# Patient Record
Sex: Female | Born: 1965 | Hispanic: No | Marital: Married | Smoking: Current every day smoker
Health system: Southern US, Community
[De-identification: ages and names within clinical notes are randomized; demographics above are authoritative.]

## PROBLEM LIST (undated history)

## (undated) DIAGNOSIS — I499 Cardiac arrhythmia, unspecified: Secondary | ICD-10-CM

## (undated) DIAGNOSIS — E7800 Pure hypercholesterolemia, unspecified: Secondary | ICD-10-CM

## (undated) DIAGNOSIS — I10 Essential (primary) hypertension: Secondary | ICD-10-CM

## (undated) DIAGNOSIS — R51 Headache: Secondary | ICD-10-CM

## (undated) DIAGNOSIS — K589 Irritable bowel syndrome without diarrhea: Secondary | ICD-10-CM

## (undated) DIAGNOSIS — F32A Depression, unspecified: Secondary | ICD-10-CM

## (undated) DIAGNOSIS — K219 Gastro-esophageal reflux disease without esophagitis: Secondary | ICD-10-CM

## (undated) DIAGNOSIS — J189 Pneumonia, unspecified organism: Secondary | ICD-10-CM

## (undated) DIAGNOSIS — IMO0001 Reserved for inherently not codable concepts without codable children: Secondary | ICD-10-CM

## (undated) DIAGNOSIS — I639 Cerebral infarction, unspecified: Secondary | ICD-10-CM

## (undated) DIAGNOSIS — E119 Type 2 diabetes mellitus without complications: Secondary | ICD-10-CM

## (undated) DIAGNOSIS — G629 Polyneuropathy, unspecified: Secondary | ICD-10-CM

## (undated) DIAGNOSIS — F419 Anxiety disorder, unspecified: Secondary | ICD-10-CM

## (undated) DIAGNOSIS — M797 Fibromyalgia: Secondary | ICD-10-CM

## (undated) DIAGNOSIS — F329 Major depressive disorder, single episode, unspecified: Secondary | ICD-10-CM

## (undated) DIAGNOSIS — N390 Urinary tract infection, site not specified: Secondary | ICD-10-CM

## (undated) DIAGNOSIS — F4310 Post-traumatic stress disorder, unspecified: Secondary | ICD-10-CM

## (undated) DIAGNOSIS — I671 Cerebral aneurysm, nonruptured: Secondary | ICD-10-CM

## (undated) DIAGNOSIS — J449 Chronic obstructive pulmonary disease, unspecified: Secondary | ICD-10-CM

## (undated) DIAGNOSIS — F319 Bipolar disorder, unspecified: Secondary | ICD-10-CM

## (undated) DIAGNOSIS — R519 Headache, unspecified: Secondary | ICD-10-CM

## (undated) HISTORY — PX: ABDOMINAL HYSTERECTOMY: SHX81

## (undated) HISTORY — PX: TUBAL LIGATION: SHX77

## (undated) HISTORY — PX: CHOLECYSTECTOMY: SHX55

## (undated) HISTORY — PX: BRAIN SURGERY: SHX531

---

## 1998-02-04 ENCOUNTER — Other Ambulatory Visit: Admission: RE | Admit: 1998-02-04 | Discharge: 1998-02-04 | Payer: Self-pay | Admitting: Family Medicine

## 2005-07-16 HISTORY — PX: BRAIN SURGERY: SHX531

## 2014-05-16 DIAGNOSIS — I639 Cerebral infarction, unspecified: Secondary | ICD-10-CM

## 2014-05-16 HISTORY — DX: Cerebral infarction, unspecified: I63.9

## 2014-11-08 ENCOUNTER — Ambulatory Visit: Payer: Medicare Other

## 2015-01-06 ENCOUNTER — Other Ambulatory Visit (HOSPITAL_COMMUNITY): Payer: Self-pay | Admitting: Interventional Radiology

## 2015-01-06 ENCOUNTER — Telehealth (HOSPITAL_COMMUNITY): Payer: Self-pay | Admitting: Interventional Radiology

## 2015-01-06 DIAGNOSIS — I671 Cerebral aneurysm, nonruptured: Secondary | ICD-10-CM

## 2015-01-06 NOTE — Telephone Encounter (Signed)
Called pt to schedule a new patient consult with Deveshwar. Patient states that she will talk to her daughter and get back in touch with me to schedule appointment. JM

## 2015-01-10 ENCOUNTER — Other Ambulatory Visit (HOSPITAL_COMMUNITY): Payer: Self-pay | Admitting: Interventional Radiology

## 2015-01-10 ENCOUNTER — Ambulatory Visit (HOSPITAL_COMMUNITY)
Admission: RE | Admit: 2015-01-10 | Discharge: 2015-01-10 | Disposition: A | Discharge status: Home / Self Care | Payer: Medicare Other | Source: Ambulatory Visit | Attending: Interventional Radiology | Admitting: Interventional Radiology

## 2015-01-10 DIAGNOSIS — I671 Cerebral aneurysm, nonruptured: Secondary | ICD-10-CM

## 2015-01-10 DIAGNOSIS — I6529 Occlusion and stenosis of unspecified carotid artery: Secondary | ICD-10-CM

## 2015-01-26 ENCOUNTER — Other Ambulatory Visit: Payer: Self-pay | Admitting: Radiology

## 2015-01-27 ENCOUNTER — Other Ambulatory Visit (HOSPITAL_COMMUNITY): Payer: Self-pay | Admitting: Interventional Radiology

## 2015-01-27 ENCOUNTER — Encounter (HOSPITAL_COMMUNITY): Payer: Self-pay

## 2015-01-27 ENCOUNTER — Ambulatory Visit (HOSPITAL_COMMUNITY)
Admission: RE | Admit: 2015-01-27 | Discharge: 2015-01-27 | Disposition: A | Discharge status: Home / Self Care | Payer: Medicare Other | Source: Ambulatory Visit | Attending: Interventional Radiology | Admitting: Interventional Radiology

## 2015-01-27 DIAGNOSIS — R51 Headache: Secondary | ICD-10-CM | POA: Insufficient documentation

## 2015-01-27 DIAGNOSIS — F17210 Nicotine dependence, cigarettes, uncomplicated: Secondary | ICD-10-CM | POA: Insufficient documentation

## 2015-01-27 DIAGNOSIS — F319 Bipolar disorder, unspecified: Secondary | ICD-10-CM | POA: Insufficient documentation

## 2015-01-27 DIAGNOSIS — E7800 Pure hypercholesterolemia, unspecified: Secondary | ICD-10-CM | POA: Insufficient documentation

## 2015-01-27 DIAGNOSIS — I671 Cerebral aneurysm, nonruptured: Secondary | ICD-10-CM

## 2015-01-27 DIAGNOSIS — E119 Type 2 diabetes mellitus without complications: Secondary | ICD-10-CM | POA: Diagnosis not present

## 2015-01-27 DIAGNOSIS — F4310 Post-traumatic stress disorder, unspecified: Secondary | ICD-10-CM | POA: Insufficient documentation

## 2015-01-27 DIAGNOSIS — Z7982 Long term (current) use of aspirin: Secondary | ICD-10-CM | POA: Diagnosis not present

## 2015-01-27 DIAGNOSIS — Z7902 Long term (current) use of antithrombotics/antiplatelets: Secondary | ICD-10-CM | POA: Diagnosis not present

## 2015-01-27 DIAGNOSIS — F419 Anxiety disorder, unspecified: Secondary | ICD-10-CM | POA: Diagnosis not present

## 2015-01-27 DIAGNOSIS — I6602 Occlusion and stenosis of left middle cerebral artery: Secondary | ICD-10-CM | POA: Diagnosis not present

## 2015-01-27 DIAGNOSIS — I6522 Occlusion and stenosis of left carotid artery: Secondary | ICD-10-CM | POA: Insufficient documentation

## 2015-01-27 DIAGNOSIS — I10 Essential (primary) hypertension: Secondary | ICD-10-CM | POA: Diagnosis not present

## 2015-01-27 DIAGNOSIS — I6529 Occlusion and stenosis of unspecified carotid artery: Secondary | ICD-10-CM

## 2015-01-27 HISTORY — DX: Post-traumatic stress disorder, unspecified: F43.10

## 2015-01-27 HISTORY — DX: Type 2 diabetes mellitus without complications: E11.9

## 2015-01-27 HISTORY — DX: Pure hypercholesterolemia, unspecified: E78.00

## 2015-01-27 HISTORY — DX: Headache: R51

## 2015-01-27 HISTORY — DX: Headache, unspecified: R51.9

## 2015-01-27 HISTORY — DX: Cerebral aneurysm, nonruptured: I67.1

## 2015-01-27 HISTORY — DX: Essential (primary) hypertension: I10

## 2015-01-27 HISTORY — DX: Anxiety disorder, unspecified: F41.9

## 2015-01-27 HISTORY — DX: Bipolar disorder, unspecified: F31.9

## 2015-01-27 HISTORY — DX: Cardiac arrhythmia, unspecified: I49.9

## 2015-01-27 LAB — APTT: aPTT: 24 seconds (ref 24–37)

## 2015-01-27 LAB — CBC
HEMATOCRIT: 43.7 % (ref 36.0–46.0)
HEMOGLOBIN: 14.6 g/dL (ref 12.0–15.0)
MCH: 29.8 pg (ref 26.0–34.0)
MCHC: 33.4 g/dL (ref 30.0–36.0)
MCV: 89.2 fL (ref 78.0–100.0)
Platelets: 208 10*3/uL (ref 150–400)
RBC: 4.9 MIL/uL (ref 3.87–5.11)
RDW: 14.7 % (ref 11.5–15.5)
WBC: 9.2 10*3/uL (ref 4.0–10.5)

## 2015-01-27 LAB — BASIC METABOLIC PANEL
Anion gap: 9 (ref 5–15)
BUN: 7 mg/dL (ref 6–20)
CHLORIDE: 102 mmol/L (ref 101–111)
CO2: 29 mmol/L (ref 22–32)
Calcium: 9.4 mg/dL (ref 8.9–10.3)
Creatinine, Ser: 0.74 mg/dL (ref 0.44–1.00)
GFR calc Af Amer: >60 mL/min (ref >60)
GFR calc non Af Amer: >60 mL/min (ref >60)
Glucose, Bld: 129 mg/dL — ABNORMAL HIGH (ref 65–99)
Potassium: 4.5 mmol/L (ref 3.5–5.1)
SODIUM: 140 mmol/L (ref 135–145)

## 2015-01-27 LAB — GLUCOSE, CAPILLARY: Glucose-Capillary: 140 mg/dL — ABNORMAL HIGH (ref 65–99)

## 2015-01-27 LAB — PROTIME-INR
INR: 0.96 (ref 0.00–1.49)
Prothrombin Time: 13 seconds (ref 11.6–15.2)

## 2015-01-27 MED ORDER — HEPARIN SOD (PORK) LOCK FLUSH 100 UNIT/ML IV SOLN
INTRAVENOUS | Status: AC | PRN
  Administered 2015-01-27: 1000 [IU] via INTRAVENOUS

## 2015-01-27 MED ORDER — MIDAZOLAM HCL 2 MG/2ML IJ SOLN
INTRAMUSCULAR | Status: AC
  Filled 2015-01-27: qty 2

## 2015-01-27 MED ORDER — SODIUM CHLORIDE 0.9 % IV SOLN
Freq: Once | INTRAVENOUS | Status: AC
  Administered 2015-01-27: 09:00:00 via INTRAVENOUS

## 2015-01-27 MED ORDER — SODIUM CHLORIDE 0.9 % IV SOLN: INTRAVENOUS | Status: AC

## 2015-01-27 MED ORDER — HEPARIN SOD (PORK) LOCK FLUSH 100 UNIT/ML IV SOLN
INTRAVENOUS | Status: AC
  Filled 2015-01-27: qty 25

## 2015-01-27 MED ORDER — IOHEXOL 300 MG/ML  SOLN
150.0000 mL | Freq: Once | INTRAMUSCULAR | Status: AC | PRN
  Administered 2015-01-27: 80 mL via INTRA_ARTERIAL

## 2015-01-27 MED ORDER — MIDAZOLAM HCL 2 MG/2ML IJ SOLN
INTRAMUSCULAR | Status: AC | PRN
  Administered 2015-01-27: 1 mg via INTRAVENOUS

## 2015-01-27 MED ORDER — FENTANYL CITRATE (PF) 100 MCG/2ML IJ SOLN
INTRAMUSCULAR | Status: AC
  Filled 2015-01-27: qty 2

## 2015-01-27 MED ORDER — FENTANYL CITRATE (PF) 100 MCG/2ML IJ SOLN
INTRAMUSCULAR | Status: AC | PRN
  Administered 2015-01-27: 25 ug via INTRAVENOUS

## 2015-01-27 MED ORDER — LIDOCAINE HCL 1 % IJ SOLN
INTRAMUSCULAR | Status: AC
  Filled 2015-01-27: qty 20

## 2015-01-27 NOTE — Procedures (Signed)
S/P 4 vessel cerebral arteriogram. Rt CFa approach. Findings. 1.Approx 75 to 80 % stenosis of Lt ICA supraclinoid segment. 2.Approx 70 to 75 % stenosis of LT MCA M! Sement. 3.Approc 50 to 60 stenosis of both VA origins. 4.Previously clipped RT MCA region aneurysm

## 2015-01-27 NOTE — Discharge Instructions (Signed)
Arteriogram Care After These instructions give you information on caring for yourself after your procedure. Your doctor may also give you more specific instructions. Call your doctor if you have any problems or questions after your procedure. HOME CARE  Do not bathe, swim, or use a hot tub until directed by your doctor. You can shower.  Do not lift anything heavier than 10 pounds (about a gallon of milk) for 2 days.  Do not walk a lot, run, or drive for 2 days.  Return to normal activities in 2 days or as told by your doctor. Finding out the results of your test Ask when your test results will be ready. Make sure you get your test results. GET HELP RIGHT AWAY IF:   You have fever.  You have more pain in your leg.  The leg that was cut is:  Bleeding.  Puffy (swollen) or red.  Cold.  Pale or changes color.  Weak.  Tingly or numb. If you go to the Emergency Room, tell your nurse that you have had an arteriogram. Take this paper with you to show the nurse. MAKE SURE YOU:  Understand these instructions.  Will watch your condition.  Will get help right away if you are not doing well or get worse. Document Released: 09/28/2008 Document Revised: 07/07/2013 Document Reviewed: 09/28/2008 [facility] Patient Information 2015 [place], [place]. This information is not intended to replace advice given to you by your health care provider. Make sure you discuss any questions you have with your health care provider.                               Excuse from Work, [employer], or Physical Activity __________________________________________________ needs to be excused from: _____ Work _____ [employer] _____ Physical activity Beginning now and through the following date: ____________________ _____ He/she may return to work or school but still avoid physical activity from now until: ____________________ _____ He/she may return to full physical activity as of:  ____________________ Caregiver's signature: ________________________________________  Date: ______________________________________________________ Document Released: 12/26/2000 Document Revised: 09/24/2011 Document Reviewed: 07/02/2005 [employer] Patient Information 2015 [place]. This information is not intended to replace advice given to you by your health care provider. Make sure you discuss any questions you have with your health care provider.

## 2015-01-27 NOTE — H&P (Signed)
Chief Complaint: CVA Cerebral aneurysm  Referring Physician(s): Deveshwar  History of Present Illness: Jill Taylor is a 49 y.o. 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 Hypertension   . Dysrhythmia   . Diabetes mellitus without complication   . Bipolar disorder   . Anxiety   . Headache   . High cholesterol   . Brain aneurysm   . PTSD (post-traumatic stress disorder)     Past Surgical History  Procedure Laterality Date  . Brain surgery    . Abdominal hysterectomy    . Cholecystectomy    . Tubal ligation      Allergies: Food; Myrica cerifera; and Sulfa antibiotics  Medications: Prior to Admission medications   Medication Sig Start Date End Date Taking? Authorizing Provider  aspirin 81 MG chewable tablet Chew 81 mg by mouth daily.   Yes Historical Provider, MD  atorvastatin (LIPITOR) 40 MG tablet Take 40 mg by mouth at bedtime. 01/11/15  Yes Historical Provider, MD  BREO ELLIPTA 100-25 MCG/INH AEPB Inhale 1 puff into the lungs daily. 11/21/14  Yes Historical Provider, MD  cetirizine (ZYRTEC) 10 MG tablet Take 10 mg by mouth daily. 01/11/15  Yes Historical Provider, MD  clopidogrel (PLAVIX) 75 MG tablet Take 75 mg by mouth daily. 11/08/14  Yes Historical Provider, MD  dicyclomine (BENTYL) 20 MG tablet Take 20 mg by mouth 3 (three) times daily as needed for spasms.  01/11/15  Yes Historical Provider, MD  gabapentin (NEURONTIN) 300 MG capsule Take 300 mg by mouth 3 (three) times daily.   Yes Historical Provider, MD  glipiZIDE (GLUCOTROL) 5 MG  tablet Take 10 mg by mouth 2 (two) times daily. 01/11/15  Yes Historical Provider, MD  INVOKANA 300 MG TABS tablet Take 300 mg by mouth daily. 01/12/15  Yes Historical Provider, MD  lisinopril (PRINIVIL,ZESTRIL) 5 MG tablet Take 5 mg by mouth daily. 01/11/15  Yes Historical Provider, MD  Melatonin 5 MG CAPS Take 5 mg by mouth at bedtime.   Yes Historical Provider, MD  metFORMIN (GLUCOPHAGE) 500 MG tablet Take 500 mg by mouth 2 (two) times daily. 01/11/15  Yes Historical Provider, MD  omeprazole (PRILOSEC) 40 MG capsule Take 40 mg by mouth 2 (two) times daily. 01/11/15  Yes Historical Provider, MD  TOVIAZ 4 MG TB24 tablet Take 4 mg by mouth every evening. 01/06/15  Yes Historical Provider, MD  VIBERZI 75 MG TABS Take 75 mg by mouth 2 (two) times daily. 01/12/15  Yes Historical Provider, MD     History reviewed. No pertinent family history.  History   Social History  . Marital Status: Married    Spouse Name: N/A  . Number of Children: N/A  . Years of Education: N/A   Social History Main Topics  . Smoking status: Current Every Day Smoker  . Smokeless tobacco: Not on file  . Alcohol Use: Not on file  . Drug Use: Not on file  . Sexual Activity: Not on file   Other Topics Concern  . None   Social History Narrative  . None  Review of Systems: A 12 point ROS discussed and pertinent positives are indicated in the HPI above.  All other systems are negative.  Review of Systems  Constitutional: Negative for fever, activity change, appetite change and fatigue.  HENT: Negative for hearing loss, tinnitus and trouble swallowing.   Respiratory: Negative for shortness of breath.   Gastrointestinal: Negative for abdominal pain.  Neurological: Positive for weakness and headaches. Negative for dizziness, tremors, seizures, syncope, facial asymmetry, speech difficulty, light-headedness and numbness.  Psychiatric/Behavioral: Negative for behavioral problems and confusion.    Vital Signs: BP  125/77 mmHg  Pulse 74  Temp(Src) 98.4 F (36.9 C)  Resp 20  Ht 5\' 1"  (1.549 m)  Wt 140 lb (63.504 kg)  BMI 26.47 kg/m2  SpO2 99%  Physical Exam  Constitutional: She is oriented to person, place, and time. She appears well-nourished.  HENT:  Head: Atraumatic.  Eyes: EOM are normal.  Neck: Neck supple.  Cardiovascular: Regular rhythm.   No murmur heard. Irreg rate  Pulmonary/Chest: Effort normal and breath sounds normal. She has no wheezes.  Abdominal: Soft. Bowel sounds are normal. There is no tenderness.  Musculoskeletal: Normal range of motion.  Neurological: She is alert and oriented to person, place, and time.  Skin: Skin is warm and dry.  Psychiatric: She has a normal mood and affect. Her behavior is normal. Judgment and thought content normal.  Nursing note and vitals reviewed.   Mallampati Score:  MD Evaluation Airway: WNL Heart: WNL Abdomen: WNL Chest/ Lungs: WNL ASA  Classification: 3 Mallampati/Airway Score: One  Imaging: Ir Radiologist Eval & Mgmt  01/13/2015   EXAM: NEW PATIENT OFFICE VISIT  CHIEF COMPLAINT: Left-sided cerebral hemispheric stroke. Expressive aphasia. History of intracranial aneurysm.  Current Pain Level: 1-10  HISTORY OF PRESENT ILLNESS: Patient is a 49 year old lady who has been referred for evaluation of history of intracranial brain aneurysm, and also history of a recent ischemic stroke with resultant difficulty with speech.  The patient is accompanied by her daughter.  According to the patient, she has symptoms of difficulty finding words and pronouncing when seen on 11/16/2014.  At the time she had had these symptoms for at least 3 days.  Additionally she has had difficulty with her memory on account of her previous clipping of her right-sided intracranial aneurysm.  At the time of her symptoms, the patient underwent MRI scan of the brain which revealed diffusion restriction in the left cerebral hemisphere mostly subcortical with left parietal  subcortical region. Also an MRA of the brain performed at that time also revealed signal loss in the right left supraclinoid/left MCA origin.  Additionally noted was the lack of flow signal in the right middle cerebral artery distribution with reconstitution distally. This probably is related to the artifact related to her previous clips.  Additionally the right internal carotid artery posterior communicating artery region demonstrates a focal outpouching which is suspicious of an intracranial aneurysm.  The visualized vertebrobasilar system appeared grossly widely patent.  Her review of systems is positive for what she describes as difficulty swallowing and hearing difficulties associated with tinnitus. She is unsure if this is related to her recent left-sided cerebral hemispheric ischemia.  She also complains of a blind spots and blurred vision although vague as to which visual field.  Her GI systems, she reports increased constipation with painful bowel movements. She also complains of occasional vomiting which does not appear to contain any blood.  She also complains of dysuria and urgency. She also has  a history of bipolar disorder with history of depression.  She also complains of a coughing every morning which may be associated with sputum production. She does get shortness of breath and attributes this to her COPD.  Past Medical History: Diabetes. High blood pressure. High cholesterol. Brain aneurysm. Stroke. Mood disorder. PTSD. Bipolar.  Past Surgical History: Cholecystectomy. Miscarriage. Tubal pregnancy. Brain aneurysm. Hysterectomy.  Medications: Ventolin inhaler. Butalbital. Plavix 75 mg. Dicyclomine. Gabapentin. Albuterol inhaler. Equalactin. Breo ellipta. Omeprazole. Invokana. Atorvastatin. Lisinopril. Zofran. Glipizide. Viberzi. Metformin. Aspirin 81 mg a day.  Allergies: Sulfa drugs.  Percocet.  Social History: Smokes a pack of cigarettes per day. Does not sniff or chew. Denies drinking alcohol or  using illicit chemicals. Does drink coffee. Does not exercise.  Family History: High blood pressure, high cholesterol, thyroid disorders in her mother. Diabetes, high blood pressure, high cholesterol in her father.  REVIEW OF SYSTEMS: As above  PHYSICAL EXAMINATION: In no acute distress.  Affect within normal limits.  Neurologically alert, awake, oriented to time, place, space. The patient does have difficulty in bringing her words out. However is able to comprehend without difficulty.  She has minimal right-sided facial droop. No lateralizing cranial nerve abnormalities including her vision.  Motor:  Minimal 5-/5 power in the right upper and lower extremities.  Sensory and coordination: Essentially within normal limits. Station and gait grossly intact.  ASSESSMENT AND PLAN: The patient's most recent MRI and MRA of the brain were reviewed with the patient and her daughter.  The areas of restricted diffusion representing ischemia were brought to their attention involving mostly the left cerebral hemisphere.  Also of note were the MRA findings as described above.  The patient was informed that her recent ischemic events into her left cerebral hemisphere were probably related to her highly suspicious tight narrowing in the distal left internal carotid artery supraclinoid segment and possibly extending into the origin of the left middle cerebral artery. Additionally a similar problem in the right middle cerebral artery distribution could not be entirely ruled out. Though the absent flow signal in this territory could be related to her previous surgical clips. The outpouching in the right internal carotid artery posterior lateral region was also reviewed with them, and felt to be suspicious of an intracranial aneurysm.  To further delineate these above suspicions, it was felt the patient would benefit from a catheter angiogram for more accurate assessment.  The angiogram was reviewed in detail. The benefits, the risks,  the alternatives were also presented.  The patient wants to proceed with a formal catheter angiogram. Questions were answered to their satisfaction. This diagnostic catheter angiogram will be undertaken as soon as possible. In the meantime. the patient has been asked to stop the Aggrenox and continue taking her aspirin and add the Plavix. Prescription was provided to her.  She was asked to call should she have any concerns or questions.   Electronically Signed   By: Luanne Bras M.D.   On: 01/11/2015 11:41    Labs:  CBC:  Recent Labs  01/27/15 0845  WBC 9.2  HGB 14.6  HCT 43.7  PLT 208    COAGS:  Recent Labs  01/27/15 0845  INR 0.96  APTT 24    BMP:  Recent Labs  01/27/15 0845  NA 140  K 4.5  CL 102  CO2 29  GLUCOSE 129*  BUN 7  CALCIUM 9.4  CREATININE 0.74  GFRNONAA >60  GFRAA >60    LIVER FUNCTION TESTS: No results  for input(s): BILITOT, AST, ALT, ALKPHOS, PROT, ALBUMIN in the last 8760 hours.  TUMOR MARKERS: No results for input(s): AFPTM, CEA, CA199, CHROMGRNA in the last 8760 hours.  Assessment and Plan:  Hx cerebral aneurysm clipping 2007 CVAs x 3 Recent MRI/MRA reviewed by Dr Estanislado Pandy reveals L ICA stenosis and R ICA/PCOM aneurysm aneurysm Now scheduled for cerebral arteriogram Risks and Benefits discussed with the patient including, but not limited to bleeding, infection, vascular injury, contrast induced renal failure, stroke or even death. All of the patient's questions were answered, patient is agreeable to proceed. Consent signed and in chart.  Thank you for this interesting consult.  I greatly enjoyed meeting Masa Lubin and look forward to participating in their care.  Signed: Charda Janis A 01/27/2015, 9:18 AM   I spent a total of  30 Minutes   in face to face in clinical consultation, greater than 50% of which was counseling/coordinating care for cerebral arteriogram

## 2015-01-28 ENCOUNTER — Other Ambulatory Visit (HOSPITAL_COMMUNITY): Payer: Self-pay | Admitting: Interventional Radiology

## 2015-01-28 DIAGNOSIS — I771 Stricture of artery: Secondary | ICD-10-CM

## 2015-02-02 ENCOUNTER — Other Ambulatory Visit: Payer: Self-pay | Admitting: Radiology

## 2015-02-07 ENCOUNTER — Encounter (HOSPITAL_COMMUNITY): Payer: Self-pay | Admitting: *Deleted

## 2015-02-08 ENCOUNTER — Other Ambulatory Visit: Payer: Self-pay | Admitting: Radiology

## 2015-02-09 ENCOUNTER — Encounter (HOSPITAL_COMMUNITY): Payer: Self-pay

## 2015-02-09 ENCOUNTER — Ambulatory Visit (HOSPITAL_COMMUNITY): Payer: Medicare Other | Admitting: Anesthesiology

## 2015-02-09 ENCOUNTER — Encounter (HOSPITAL_COMMUNITY): Payer: Self-pay | Admitting: *Deleted

## 2015-02-09 ENCOUNTER — Encounter (HOSPITAL_COMMUNITY): Payer: Self-pay | Admitting: Certified Registered Nurse Anesthetist

## 2015-02-09 ENCOUNTER — Ambulatory Visit (HOSPITAL_COMMUNITY)
Admission: RE | Admit: 2015-02-09 | Discharge: 2015-02-09 | Disposition: A | Discharge status: Home / Self Care | Payer: Medicare Other | Source: Ambulatory Visit | Attending: Interventional Radiology | Admitting: Interventional Radiology

## 2015-02-09 ENCOUNTER — Inpatient Hospital Stay (HOSPITAL_COMMUNITY)
Admission: RE | Admit: 2015-02-09 | Discharge: 2015-02-10 | DRG: 027 | Disposition: A | Discharge status: Home / Self Care | Payer: Medicare Other | Source: Ambulatory Visit | Attending: Interventional Radiology | Admitting: Interventional Radiology

## 2015-02-09 ENCOUNTER — Encounter (HOSPITAL_COMMUNITY)
Admission: RE | Disposition: A | Discharge status: Home / Self Care | Payer: Self-pay | Source: Ambulatory Visit | Attending: Interventional Radiology

## 2015-02-09 DIAGNOSIS — I6522 Occlusion and stenosis of left carotid artery: Secondary | ICD-10-CM | POA: Diagnosis present

## 2015-02-09 DIAGNOSIS — I771 Stricture of artery: Secondary | ICD-10-CM

## 2015-02-09 DIAGNOSIS — Z79899 Other long term (current) drug therapy: Secondary | ICD-10-CM | POA: Diagnosis not present

## 2015-02-09 DIAGNOSIS — I6602 Occlusion and stenosis of left middle cerebral artery: Principal | ICD-10-CM | POA: Diagnosis present

## 2015-02-09 DIAGNOSIS — Z7982 Long term (current) use of aspirin: Secondary | ICD-10-CM

## 2015-02-09 DIAGNOSIS — Z7902 Long term (current) use of antithrombotics/antiplatelets: Secondary | ICD-10-CM | POA: Diagnosis not present

## 2015-02-09 DIAGNOSIS — Z8673 Personal history of transient ischemic attack (TIA), and cerebral infarction without residual deficits: Secondary | ICD-10-CM | POA: Diagnosis not present

## 2015-02-09 DIAGNOSIS — I6609 Occlusion and stenosis of unspecified middle cerebral artery: Secondary | ICD-10-CM | POA: Diagnosis present

## 2015-02-09 DIAGNOSIS — R51 Headache: Secondary | ICD-10-CM | POA: Diagnosis present

## 2015-02-09 HISTORY — DX: Chronic obstructive pulmonary disease, unspecified: J44.9

## 2015-02-09 HISTORY — DX: Cerebral infarction, unspecified: I63.9

## 2015-02-09 HISTORY — DX: Depression, unspecified: F32.A

## 2015-02-09 HISTORY — DX: Major depressive disorder, single episode, unspecified: F32.9

## 2015-02-09 HISTORY — DX: Urinary tract infection, site not specified: N39.0

## 2015-02-09 HISTORY — DX: Irritable bowel syndrome, unspecified: K58.9

## 2015-02-09 HISTORY — DX: Pneumonia, unspecified organism: J18.9

## 2015-02-09 HISTORY — DX: Reserved for inherently not codable concepts without codable children: IMO0001

## 2015-02-09 HISTORY — DX: Gastro-esophageal reflux disease without esophagitis: K21.9

## 2015-02-09 HISTORY — PX: RADIOLOGY WITH ANESTHESIA: SHX6223

## 2015-02-09 LAB — COMPREHENSIVE METABOLIC PANEL
ALK PHOS: 139 U/L — AB (ref 38–126)
ALT: 14 U/L (ref 14–54)
ANION GAP: 9 (ref 5–15)
AST: 14 U/L — AB (ref 15–41)
Albumin: 3.4 g/dL — ABNORMAL LOW (ref 3.5–5.0)
BUN: 8 mg/dL (ref 6–20)
CO2: 26 mmol/L (ref 22–32)
CREATININE: 0.77 mg/dL (ref 0.44–1.00)
Calcium: 8.9 mg/dL (ref 8.9–10.3)
Chloride: 103 mmol/L (ref 101–111)
Glucose, Bld: 96 mg/dL (ref 65–99)
POTASSIUM: 4 mmol/L (ref 3.5–5.1)
SODIUM: 138 mmol/L (ref 135–145)
TOTAL PROTEIN: 6.1 g/dL — AB (ref 6.5–8.1)
Total Bilirubin: 0.4 mg/dL (ref 0.3–1.2)

## 2015-02-09 LAB — CBC WITH DIFFERENTIAL/PLATELET
Basophils Absolute: 0.1 10*3/uL (ref 0.0–0.1)
Basophils Relative: 1 % (ref 0–1)
Eosinophils Absolute: 0.2 10*3/uL (ref 0.0–0.7)
Eosinophils Relative: 2 % (ref 0–5)
HCT: 43 % (ref 36.0–46.0)
Hemoglobin: 14.4 g/dL (ref 12.0–15.0)
Lymphocytes Relative: 35 % (ref 12–46)
Lymphs Abs: 3.1 10*3/uL (ref 0.7–4.0)
MCH: 30 pg (ref 26.0–34.0)
MCHC: 33.5 g/dL (ref 30.0–36.0)
MCV: 89.6 fL (ref 78.0–100.0)
MONO ABS: 0.7 10*3/uL (ref 0.1–1.0)
Monocytes Relative: 7 % (ref 3–12)
Neutro Abs: 5 10*3/uL (ref 1.7–7.7)
Neutrophils Relative %: 55 % (ref 43–77)
Platelets: 223 10*3/uL (ref 150–400)
RBC: 4.8 MIL/uL (ref 3.87–5.11)
RDW: 14.1 % (ref 11.5–15.5)
WBC: 9 10*3/uL (ref 4.0–10.5)

## 2015-02-09 LAB — POCT ACTIVATED CLOTTING TIME
ACTIVATED CLOTTING TIME: 196 s
Activated Clotting Time: 171 seconds
Activated Clotting Time: 190 seconds

## 2015-02-09 LAB — GLUCOSE, CAPILLARY
GLUCOSE-CAPILLARY: 84 mg/dL (ref 65–99)
GLUCOSE-CAPILLARY: 99 mg/dL (ref 65–99)
Glucose-Capillary: 101 mg/dL — ABNORMAL HIGH (ref 65–99)

## 2015-02-09 LAB — HEPARIN LEVEL (UNFRACTIONATED): Heparin Unfractionated: 0.13 IU/mL — ABNORMAL LOW (ref 0.30–0.70)

## 2015-02-09 LAB — MRSA PCR SCREENING: MRSA by PCR: NEGATIVE

## 2015-02-09 LAB — PLATELET INHIBITION P2Y12: Platelet Function  P2Y12: 169 [PRU] — ABNORMAL LOW (ref 194–418)

## 2015-02-09 SURGERY — RADIOLOGY WITH ANESTHESIA
Anesthesia: General

## 2015-02-09 MED ORDER — ESMOLOL HCL 10 MG/ML IV SOLN
INTRAVENOUS | Status: DC | PRN
  Administered 2015-02-09: 10 mg via INTRAVENOUS
  Administered 2015-02-09: 20 mg via INTRAVENOUS
  Administered 2015-02-09: 10 mg via INTRAVENOUS

## 2015-02-09 MED ORDER — NICARDIPINE HCL IN NACL 20-0.86 MG/200ML-% IV SOLN
5.0000 mg/h | INTRAVENOUS | Status: DC
  Filled 2015-02-09: qty 200

## 2015-02-09 MED ORDER — HYDROMORPHONE HCL 1 MG/ML IJ SOLN: 0.2500 mg | INTRAMUSCULAR | Status: DC | PRN

## 2015-02-09 MED ORDER — PANTOPRAZOLE SODIUM 40 MG PO TBEC
40.0000 mg | DELAYED_RELEASE_TABLET | Freq: Every day | ORAL | Status: DC
  Administered 2015-02-09 – 2015-02-10 (×2): 40 mg via ORAL
  Filled 2015-02-09 (×2): qty 1

## 2015-02-09 MED ORDER — PROTAMINE SULFATE 10 MG/ML IV SOLN
INTRAVENOUS | Status: DC | PRN
  Administered 2015-02-09 (×2): 5 mg via INTRAVENOUS

## 2015-02-09 MED ORDER — ALBUTEROL SULFATE (2.5 MG/3ML) 0.083% IN NEBU: 2.5000 mg | INHALATION_SOLUTION | Freq: Four times a day (QID) | RESPIRATORY_TRACT | Status: DC | PRN

## 2015-02-09 MED ORDER — PROPOFOL 10 MG/ML IV BOLUS
INTRAVENOUS | Status: DC | PRN
  Administered 2015-02-09: 20 mg via INTRAVENOUS
  Administered 2015-02-09 (×2): 30 mg via INTRAVENOUS
  Administered 2015-02-09: 50 mg via INTRAVENOUS
  Administered 2015-02-09: 20 mg via INTRAVENOUS
  Administered 2015-02-09: 150 mg via INTRAVENOUS

## 2015-02-09 MED ORDER — LIDOCAINE HCL (CARDIAC) 20 MG/ML IV SOLN
INTRAVENOUS | Status: DC | PRN
  Administered 2015-02-09: 100 mg via INTRAVENOUS

## 2015-02-09 MED ORDER — GABAPENTIN 300 MG PO CAPS
300.0000 mg | ORAL_CAPSULE | Freq: Three times a day (TID) | ORAL | Status: DC
  Administered 2015-02-09 – 2015-02-10 (×2): 300 mg via ORAL
  Filled 2015-02-09 (×4): qty 1

## 2015-02-09 MED ORDER — SODIUM CHLORIDE 0.9 % IV SOLN
INTRAVENOUS | Status: DC
  Administered 2015-02-09: 75 mL via INTRAVENOUS

## 2015-02-09 MED ORDER — CEFAZOLIN SODIUM-DEXTROSE 2-3 GM-% IV SOLR
2.0000 g | Freq: Once | INTRAVENOUS | Status: AC
  Administered 2015-02-09: 2 g via INTRAVENOUS

## 2015-02-09 MED ORDER — ONDANSETRON HCL 4 MG/2ML IJ SOLN: 4.0000 mg | Freq: Four times a day (QID) | INTRAMUSCULAR | Status: DC | PRN

## 2015-02-09 MED ORDER — ONDANSETRON HCL 4 MG/2ML IJ SOLN
INTRAMUSCULAR | Status: DC | PRN
  Administered 2015-02-09: 4 mg via INTRAVENOUS

## 2015-02-09 MED ORDER — CEFAZOLIN SODIUM-DEXTROSE 2-3 GM-% IV SOLR
INTRAVENOUS | Status: AC
  Filled 2015-02-09: qty 50

## 2015-02-09 MED ORDER — PROMETHAZINE HCL 25 MG/ML IJ SOLN: 6.2500 mg | INTRAMUSCULAR | Status: DC | PRN

## 2015-02-09 MED ORDER — NITROGLYCERIN IN D5W 200-5 MCG/ML-% IV SOLN
INTRAVENOUS | Status: DC | PRN
  Administered 2015-02-09: 5 ug/min via INTRAVENOUS

## 2015-02-09 MED ORDER — ACETAMINOPHEN 650 MG RE SUPP: 650.0000 mg | Freq: Four times a day (QID) | RECTAL | Status: DC | PRN

## 2015-02-09 MED ORDER — MELATONIN 10 MG PO TABS: 10.0000 mg | ORAL_TABLET | Freq: Every day | ORAL | Status: DC

## 2015-02-09 MED ORDER — ACETAMINOPHEN 500 MG PO TABS: 1000.0000 mg | ORAL_TABLET | Freq: Four times a day (QID) | ORAL | Status: DC | PRN

## 2015-02-09 MED ORDER — HEPARIN (PORCINE) IN NACL 100-0.45 UNIT/ML-% IJ SOLN
INTRAMUSCULAR | Status: DC
Start: 2015-02-09 — End: 2015-02-09
  Filled 2015-02-09: qty 250

## 2015-02-09 MED ORDER — HEPARIN SODIUM (PORCINE) 1000 UNIT/ML IJ SOLN
INTRAMUSCULAR | Status: DC | PRN
  Administered 2015-02-09: 3 mL via INTRAVENOUS
  Administered 2015-02-09: .5 mL via INTRAVENOUS

## 2015-02-09 MED ORDER — ROCURONIUM BROMIDE 100 MG/10ML IV SOLN
INTRAVENOUS | Status: DC | PRN
Start: 2015-02-09 — End: 2015-02-09
  Administered 2015-02-09: 50 mg via INTRAVENOUS

## 2015-02-09 MED ORDER — GABAPENTIN 600 MG PO TABS
300.0000 mg | ORAL_TABLET | Freq: Three times a day (TID) | ORAL | Status: DC
  Administered 2015-02-09: 300 mg via ORAL
  Filled 2015-02-09 (×2): qty 0.5

## 2015-02-09 MED ORDER — INSULIN ASPART 100 UNIT/ML ~~LOC~~ SOLN: 0.0000 [IU] | Freq: Three times a day (TID) | SUBCUTANEOUS | Status: DC

## 2015-02-09 MED ORDER — BUDESONIDE 0.25 MG/2ML IN SUSP
0.2500 mg | Freq: Two times a day (BID) | RESPIRATORY_TRACT | Status: DC
  Administered 2015-02-09 – 2015-02-10 (×2): 0.25 mg via RESPIRATORY_TRACT
  Filled 2015-02-09 (×4): qty 2

## 2015-02-09 MED ORDER — CLOPIDOGREL BISULFATE 75 MG PO TABS: 75.0000 mg | ORAL_TABLET | Freq: Every day | ORAL | Status: DC

## 2015-02-09 MED ORDER — IOHEXOL 300 MG/ML  SOLN
150.0000 mL | Freq: Once | INTRAMUSCULAR | Status: AC | PRN
Start: 2015-02-09 — End: 2015-02-09
  Administered 2015-02-09: 64 mL via INTRAVENOUS

## 2015-02-09 MED ORDER — ASPIRIN EC 325 MG PO TBEC
325.0000 mg | DELAYED_RELEASE_TABLET | ORAL | Status: AC
  Administered 2015-02-09: 325 mg via ORAL
  Filled 2015-02-09 (×2): qty 1

## 2015-02-09 MED ORDER — ASPIRIN 325 MG PO TABS
325.0000 mg | ORAL_TABLET | Freq: Every day | ORAL | Status: DC
  Administered 2015-02-10: 325 mg via ORAL
  Filled 2015-02-09 (×2): qty 1

## 2015-02-09 MED ORDER — MIDAZOLAM HCL 5 MG/5ML IJ SOLN
INTRAMUSCULAR | Status: DC | PRN
  Administered 2015-02-09 (×2): 1 mg via INTRAVENOUS

## 2015-02-09 MED ORDER — HEPARIN (PORCINE) IN NACL 100-0.45 UNIT/ML-% IJ SOLN
600.0000 [IU]/h | INTRAMUSCULAR | Status: DC
  Administered 2015-02-09: 600 [IU]/h via INTRAVENOUS
  Filled 2015-02-09: qty 250

## 2015-02-09 MED ORDER — CLOPIDOGREL BISULFATE 75 MG PO TABS
75.0000 mg | ORAL_TABLET | Freq: Every day | ORAL | Status: DC
  Administered 2015-02-10: 75 mg via ORAL
  Filled 2015-02-09 (×2): qty 1

## 2015-02-09 MED ORDER — EPHEDRINE SULFATE 50 MG/ML IJ SOLN
INTRAMUSCULAR | Status: DC | PRN
Start: 2015-02-09 — End: 2015-02-09
  Administered 2015-02-09 (×6): 5 mg via INTRAVENOUS

## 2015-02-09 MED ORDER — NEOSTIGMINE METHYLSULFATE 10 MG/10ML IV SOLN
INTRAVENOUS | Status: DC | PRN
  Administered 2015-02-09: 3 mg via INTRAVENOUS

## 2015-02-09 MED ORDER — FLUTICASONE FUROATE-VILANTEROL 100-25 MCG/INH IN AEPB: 1.0000 | INHALATION_SPRAY | Freq: Every day | RESPIRATORY_TRACT | Status: DC

## 2015-02-09 MED ORDER — IOHEXOL 300 MG/ML  SOLN
100.0000 mL | Freq: Once | INTRAMUSCULAR | Status: AC | PRN
Start: 2015-02-09 — End: 2015-02-09
  Administered 2015-02-09: 20 mL via INTRAVENOUS

## 2015-02-09 MED ORDER — FENTANYL CITRATE (PF) 100 MCG/2ML IJ SOLN
INTRAMUSCULAR | Status: DC | PRN
  Administered 2015-02-09: 100 ug via INTRAVENOUS
  Administered 2015-02-09: 50 ug via INTRAVENOUS
  Administered 2015-02-09 (×2): 25 ug via INTRAVENOUS
  Administered 2015-02-09: 50 ug via INTRAVENOUS

## 2015-02-09 MED ORDER — INSULIN ASPART 100 UNIT/ML ~~LOC~~ SOLN: 0.0000 [IU] | Freq: Every day | SUBCUTANEOUS | Status: DC

## 2015-02-09 MED ORDER — PHENYLEPHRINE HCL 10 MG/ML IJ SOLN
10.0000 mg | INTRAVENOUS | Status: DC | PRN
  Administered 2015-02-09: 10 ug/min via INTRAVENOUS

## 2015-02-09 MED ORDER — LACTATED RINGERS IV SOLN
INTRAVENOUS | Status: DC | PRN
  Administered 2015-02-09: 08:00:00 via INTRAVENOUS

## 2015-02-09 MED ORDER — GLYCOPYRROLATE 0.2 MG/ML IJ SOLN
INTRAMUSCULAR | Status: DC | PRN
  Administered 2015-02-09: 0.1 mg via INTRAVENOUS
  Administered 2015-02-09: 0.4 mg via INTRAVENOUS
  Administered 2015-02-09: 0.1 mg via INTRAVENOUS

## 2015-02-09 MED ORDER — NITROGLYCERIN 1 MG/10 ML FOR IR/CATH LAB
INTRA_ARTERIAL | Status: AC
  Filled 2015-02-09: qty 10

## 2015-02-09 MED ORDER — SODIUM CHLORIDE 0.9 % IV SOLN
INTRAVENOUS | Status: DC
  Administered 2015-02-09 (×2): via INTRAVENOUS

## 2015-02-09 MED ORDER — ASPIRIN EC 81 MG PO TBEC: 81.0000 mg | DELAYED_RELEASE_TABLET | Freq: Every day | ORAL | Status: DC

## 2015-02-09 MED ORDER — NIMODIPINE 30 MG PO CAPS
0.0000 mg | ORAL_CAPSULE | ORAL | Status: AC
  Administered 2015-02-09: 30 mg via ORAL
  Filled 2015-02-09: qty 2
  Filled 2015-02-09: qty 1

## 2015-02-09 MED ORDER — ARFORMOTEROL TARTRATE 15 MCG/2ML IN NEBU
15.0000 ug | INHALATION_SOLUTION | Freq: Two times a day (BID) | RESPIRATORY_TRACT | Status: DC
  Administered 2015-02-09 – 2015-02-10 (×2): 15 ug via RESPIRATORY_TRACT
  Filled 2015-02-09 (×4): qty 2

## 2015-02-09 NOTE — H&P (Signed)
HPI:  The patient has had a H&P performed within the last 30 days, all history, medications, and exam have been reviewed. The patient denies any interval changes since the H&P.  Pt with previous cerebral aneurysm clipping 2007  Has FH of cerebral aneurysms  Has had known CVAs x 3  Most recent 11/2014  Referred to Dr Estanislado Pandy regarding CVA and continued headaches  He saw pt in consult and reviewed imaging form previous MD  Reveals L Internal carotid artery stenosis  Also noted R Internal carotid artery/ PCOM aneurysm  Scheduled today for cerebral arteriogram and evaluation for possible treatment   Medications: Prior to Admission medications   Medication Sig Start Date End Date Taking? Authorizing Provider  albuterol (PROVENTIL HFA;VENTOLIN HFA) 108 (90 BASE) MCG/ACT inhaler Inhale 2 puffs into the lungs every 6 (six) hours as needed for wheezing or shortness of breath.    Historical Provider, MD  aspirin EC 81 MG tablet Take 81 mg by mouth daily.    Historical Provider, MD  atorvastatin (LIPITOR) 40 MG tablet Take 40 mg by mouth at bedtime. 01/11/15   Historical Provider, MD  canagliflozin (INVOKANA) 300 MG TABS tablet Take 300 mg by mouth daily.    Historical Provider, MD  cetirizine (ZYRTEC) 10 MG tablet Take 10 mg by mouth daily. 01/11/15   Historical Provider, MD  clopidogrel (PLAVIX) 75 MG tablet Take 75 mg by mouth daily. 11/08/14   Historical Provider, MD  clotrimazole (LOTRIMIN) 1 % cream Apply 1 application topically at bedtime as needed (athlete's foot).    Historical Provider, MD  dicyclomine (BENTYL) 20 MG tablet Take 20 mg by mouth 2 (two) times daily with a meal.  01/11/15   Historical Provider, MD  Eluxadoline (VIBERZI) 75 MG TABS Take 75 mg by mouth 2 (two) times daily.    Historical Provider, MD  Fluticasone Furoate-Vilanterol (BREO ELLIPTA) 100-25 MCG/INH AEPB Inhale 1 puff into the lungs daily.    Historical Provider, MD  gabapentin (NEURONTIN) 600 MG tablet Take  300 mg by mouth 3 (three) times daily.  01/03/15   Historical Provider, MD  glipiZIDE (GLUCOTROL) 5 MG tablet Take 10 mg by mouth 2 (two) times daily. 01/11/15   Historical Provider, MD  lisinopril (PRINIVIL,ZESTRIL) 5 MG tablet Take 5 mg by mouth daily. 01/11/15   Historical Provider, MD  Melatonin 10 MG TABS Take 10 mg by mouth at bedtime.    Historical Provider, MD  metFORMIN (GLUCOPHAGE) 500 MG tablet Take 500 mg by mouth 2 (two) times daily. 01/11/15   Historical Provider, MD  omeprazole (PRILOSEC) 40 MG capsule Take 40 mg by mouth 2 (two) times daily. 01/11/15   Historical Provider, MD     Vital Signs: There were no vitals taken for this visit.  Physical Exam  Mallampati Score:  MD Evaluation Airway: WNL Heart: WNL Abdomen: WNL Chest/ Lungs: WNL ASA  Classification: 3 Mallampati/Airway Score: One  Labs:  CBC:  Recent Labs  01/27/15 0845  WBC 9.2  HGB 14.6  HCT 43.7  PLT 208    COAGS:  Recent Labs  01/27/15 0845  INR 0.96  APTT 24    BMP:  Recent Labs  01/27/15 0845  NA 140  K 4.5  CL 102  CO2 29  GLUCOSE 129*  BUN 7  CALCIUM 9.4  CREATININE 0.74  GFRNONAA >60  GFRAA >60    LIVER FUNCTION TESTS: No results for input(s): BILITOT, AST, ALT, ALKPHOS, PROT, ALBUMIN in the last 8760 hours.  Assessment/Plan:   Hx cerebral aneurysm clipping 2007  CVAs x 3  Recent MRI/MRA reviewed by Dr Estanislado Pandy reveals L ICA stenosis and R ICA/PCOM aneurysm aneurysm  Now scheduled for cerebral arteriogram  Risks and Benefits discussed with the patient including, but not limited to bleeding, infection, vascular injury, contrast induced renal failure, stroke or even death.  All of the patient's questions were answered, patient is agreeable to proceed. Consent signed and in chart.   SignedMurrell Redden 02/09/2015, 7:49 AM

## 2015-02-09 NOTE — Transfer of Care (Signed)
Immediate Anesthesia Transfer of Care Note  Patient: Jill Taylor  Procedure(s) Performed: Procedure(s): ANGIOPLASTY (N/A)  Patient Location: PACU  Anesthesia Type:General  Level of Consciousness: awake, alert , oriented, patient cooperative and responds to stimulation  Airway & Oxygen Therapy: Patient Spontanous Breathing and Patient connected to face mask oxygen  Post-op Assessment: Report given to RN, Post -op Vital signs reviewed and stable, Patient moving all extremities X 4 and Patient able to stick tongue midline  Post vital signs: Reviewed and stable  Last Vitals:  Filed Vitals:   02/09/15 0625  BP: 131/69  Pulse: 62  Temp: 36.8 C  Resp: 18    Complications: No apparent anesthesia complications

## 2015-02-09 NOTE — Anesthesia Procedure Notes (Signed)
Procedure Name: Intubation Performed by: Judeth Cornfield T Pre-anesthesia Checklist: Patient identified, Emergency Drugs available, Suction available, Patient being monitored and Timeout performed Patient Re-evaluated:Patient Re-evaluated prior to inductionOxygen Delivery Method: Circle system utilized Preoxygenation: Pre-oxygenation with 100% oxygen Intubation Type: IV induction Ventilation: Mask ventilation without difficulty Laryngoscope Size: Mac and 3 Grade View: Grade I Tube type: Oral Tube size: 7.0 mm Number of attempts: 1 Airway Equipment and Method: Stylet Placement Confirmation: ETT inserted through vocal cords under direct vision,  positive ETCO2 and CO2 detector Secured at: 21 cm Tube secured with: Tape Dental Injury: Teeth and Oropharynx as per pre-operative assessment

## 2015-02-09 NOTE — Progress Notes (Signed)
Referring Physician(s): Deveshwar  Chief Complaint:  Post op f/u for leflt common carotid arteriogram,followed by balloon angioplasty of LT ICA supraclinoid seg and Lt MCA M1 seg with residual stenosis of approx 15 %  Subjective:  [name] is seen in the recovery room.  She is still very groggy from her anesthesia, however she does wake up and follow commands.  She has no complaints at this time.  Allergies: Other and Sulfa antibiotics  Medications: Prior to Admission medications   Medication Sig Start Date End Date Taking? Authorizing Provider  albuterol (PROVENTIL HFA;VENTOLIN HFA) 108 (90 BASE) MCG/ACT inhaler Inhale 2 puffs into the lungs every 6 (six) hours as needed for wheezing or shortness of breath.   Yes Historical Provider, MD  aspirin EC 81 MG tablet Take 81 mg by mouth daily.   Yes Historical Provider, MD  atorvastatin (LIPITOR) 40 MG tablet Take 40 mg by mouth at bedtime. 01/11/15  Yes Historical Provider, MD  canagliflozin (INVOKANA) 300 MG TABS tablet Take 300 mg by mouth daily.   Yes Historical Provider, MD  cetirizine (ZYRTEC) 10 MG tablet Take 10 mg by mouth daily. 01/11/15  Yes Historical Provider, MD  clopidogrel (PLAVIX) 75 MG tablet Take 75 mg by mouth daily. 11/08/14  Yes Historical Provider, MD  clotrimazole (LOTRIMIN) 1 % cream Apply 1 application topically at bedtime as needed (athlete's foot).   Yes Historical Provider, MD  dicyclomine (BENTYL) 20 MG tablet Take 20 mg by mouth 2 (two) times daily with a meal.  01/11/15  Yes Historical Provider, MD  Eluxadoline (VIBERZI) 75 MG TABS Take 75 mg by mouth 2 (two) times daily.   Yes Historical Provider, MD  Fluticasone Furoate-Vilanterol (BREO ELLIPTA) 100-25 MCG/INH AEPB Inhale 1 puff into the lungs daily.   Yes Historical Provider, MD  gabapentin (NEURONTIN) 600 MG tablet Take 300 mg by mouth 3 (three) times daily.  01/03/15  Yes Historical Provider, MD  glipiZIDE (GLUCOTROL) 5 MG tablet Take 10 mg by mouth  2 (two) times daily. 01/11/15  Yes Historical Provider, MD  lisinopril (PRINIVIL,ZESTRIL) 5 MG tablet Take 5 mg by mouth daily. 01/11/15  Yes Historical Provider, MD  Melatonin 10 MG TABS Take 10 mg by mouth at bedtime.   Yes Historical Provider, MD  metFORMIN (GLUCOPHAGE) 500 MG tablet Take 500 mg by mouth 2 (two) times daily. 01/11/15  Yes Historical Provider, MD  omeprazole (PRILOSEC) 40 MG capsule Take 40 mg by mouth 2 (two) times daily. 01/11/15  Yes Historical Provider, MD     Vital Signs: BP 123/61 mmHg  Pulse 76  Temp(Src) 97 F (36.1 C) (Oral)  Resp 21  Wt 140 lb (63.504 kg)  SpO2 97%  Physical Exam  Neurological: She is alert. She has normal strength. No cranial nerve deficit or sensory deficit.   Sleepy but awakens easily and follows commands  Right groin with dressing in place, soft, no hematoma or pseudoaneurysm.   Imaging: No results found.  Labs:  CBC:  Recent Labs  01/27/15 0845 02/09/15 0738  WBC 9.2 9.0  HGB 14.6 14.4  HCT 43.7 43.0  PLT 208 223    COAGS:  Recent Labs  01/27/15 0845  INR 0.96  APTT 24    BMP:  Recent Labs  01/27/15 0845 02/09/15 0738  NA 140 138  K 4.5 4.0  CL 102 103  CO2 29 26  GLUCOSE 129* 96  BUN 7 8  CALCIUM 9.4 8.9  CREATININE 0.74 0.77  GFRNONAA >  60 >60  GFRAA >60 >60    LIVER FUNCTION TESTS:  Recent Labs  02/09/15 0738  BILITOT 0.4  AST 14*  ALT 14  ALKPHOS 139*  PROT 6.1*  ALBUMIN 3.4*    Assessment and Plan:  S/P left common carotid arteriogram,followed by balloon angioplasty of LT ICA supraclinoid seg and Lt MCA M1 seg with residual stenosis of approx 15 %  Neurologically intact  On heparin drip  Continue current care.  Signed: Murrell Redden PA-C 02/09/2015, 1:41 PM   I spent a total of 15 Minutes in face to face in clinical consultation/evaluation, greater than 50% of which was counseling/coordinating care for post carotid agram.

## 2015-02-09 NOTE — Care Management Note (Signed)
Case Management Note  Patient Details  Name: Jill Taylor MRN: [id number] Date of Birth: 23-Sep-1965  Subjective/Objective:                    Action/Plan: UR completed   Expected Discharge Date:                  Expected Discharge Plan:  Home/Self Care  In-House Referral:     Discharge planning Services     Post Acute Care Choice:    Choice offered to:     DME Arranged:    DME Agency:     HH Arranged:    [place] Agency:     Status of Service:  In process, will continue to follow  Medicare Important Message Given:    Date Medicare IM Given:    Medicare IM give by:    Date Additional Medicare IM Given:    Additional Medicare Important Message give by:     If discussed at [place] of Stay Meetings, dates discussed:    Additional Comments:  Marilu Favre, RN 02/09/2015, 2:31 PM

## 2015-02-09 NOTE — Progress Notes (Signed)
Pt reports taking Plavix at home this morning but did not take aspirin.

## 2015-02-09 NOTE — Anesthesia Postprocedure Evaluation (Signed)
  Anesthesia Post-op Note  Patient: Jill Taylor  Procedure(s) Performed: Procedure(s): ANGIOPLASTY (N/A)  Patient Location: PACU  Anesthesia Type:General  Level of Consciousness: awake and alert   Airway and Oxygen Therapy: Patient Spontanous Breathing  Post-op Pain: mild  Post-op Assessment: Post-op Vital signs reviewed              Post-op Vital Signs: stable  Last Vitals:  Filed Vitals:   02/09/15 1402  BP:   Pulse: 70  Temp: 36.1 C  Resp: 19    Complications: No apparent anesthesia complications

## 2015-02-09 NOTE — Progress Notes (Signed)
ANTICOAGULATION CONSULT NOTE  Pharmacy Consult for heparin Indication: s/p interventional radiology  Allergies  Allergen Reactions  . Other Shortness Of Breath    Allergy to pine trees, hickory trees and mulberry trees & 2 types of fungus   . Sulfa Antibiotics Hives    Patient Measurements: Height: 5\' 1"  (154.9 cm) Weight: 154 lb 15.7 oz (70.3 kg) IBW/kg (Calculated) : 47.8 Heparin Dosing Weight: 63 kg  Vital Signs: Temp: 98.5 F (36.9 C) (07/27 2000) Temp Source: Oral (07/27 2000) BP: 114/73 mmHg (07/27 1900) Pulse Rate: 75 (07/27 1900)  Labs:  Recent Labs  02/09/15 0738 02/09/15 1850  HGB 14.4  --   HCT 43.0  --   PLT 223  --   HEPARINUNFRC  --  0.13*  CREATININE 0.77  --     Estimated Creatinine Clearance: 78 mL/min (by C-G formula based on Cr of 0.77).   Assessment: 48yoF s/p interventional radiology for middle cerebral artery stenosis on heparin. First level therapeutic at 0.13 units/mL. No bleeding noted.  Goal of Therapy:  Heparin level 0.1-0.25 units/ml Monitor platelets by anticoagulation protocol: Yes   Plan:  -Heparin drip at 600 units/hr  -Monitor daily HL, CBC, and s/sx of bleeding -anticipate gtt to be turned off tomorrow morning.   Onika Gudiel D. Milbern Doescher, PharmD, BCPS Clinical Pharmacist Pager: [phone] 02/09/2015 8:43 PM

## 2015-02-09 NOTE — Anesthesia Preprocedure Evaluation (Addendum)
Anesthesia Evaluation  Patient identified by MRN, date of birth, ID band Patient awake    Reviewed: Allergy & Precautions, NPO status , Patient's Chart, lab work & pertinent test results  Airway Mallampati: I  TM Distance: >3 FB Neck ROM: Full    Dental  (+) Edentulous Upper, Poor Dentition   Pulmonary shortness of breath, COPDCurrent Smoker,  breath sounds clear to auscultation        Cardiovascular hypertension, Pt. on medications + Peripheral Vascular Disease + dysrhythmias Rhythm:Regular Rate:Normal     Neuro/Psych  Headaches, Anxiety CVA    GI/Hepatic Neg liver ROS, GERD-  ,  Endo/Other  diabetes, Oral Hypoglycemic Agents  Renal/GU negative Renal ROS     Musculoskeletal   Abdominal   Peds  Hematology negative hematology ROS (+)   Anesthesia Other Findings   Reproductive/Obstetrics                            Anesthesia Physical Anesthesia Plan  ASA: III  Anesthesia Plan: General   Post-op Pain Management:    Induction: Intravenous  Airway Management Planned: Oral ETT  Additional Equipment: Arterial line  Intra-op Plan:   Post-operative Plan: Extubation in OR  Informed Consent: I have reviewed the patients History and Physical, chart, labs and discussed the procedure including the risks, benefits and alternatives for the proposed anesthesia with the patient or authorized representative who has indicated his/her understanding and acceptance.   Dental advisory given  Plan Discussed with: CRNA and Surgeon  Anesthesia Plan Comments:         Anesthesia Quick Evaluation

## 2015-02-09 NOTE — Procedures (Signed)
S/P lt common carotid arteriogram,followed by balloon angioplasty of LT ICA supraclinoid seg and Lt MCA M1 seg with residual stenosis of approx 15 %

## 2015-02-09 NOTE — Addendum Note (Signed)
Addendum  created 02/09/15 1508 by Verdie Drown, CRNA   Modules edited: Anesthesia Medication Administration

## 2015-02-09 NOTE — Progress Notes (Signed)
ANTICOAGULATION CONSULT NOTE - Initial Consult  Pharmacy Consult for heparin Indication: s/p interventional radiology  Allergies  Allergen Reactions  . Other Shortness Of Breath    Allergy to pine trees, hickory trees and mulberry trees & 2 types of fungus   . Sulfa Antibiotics Hives    Patient Measurements: Weight: 140 lb (63.504 kg) Heparin Dosing Weight: 63 kg  Vital Signs: Temp: 98.2 F (36.8 C) (07/27 0625) Temp Source: Oral (07/27 0625) BP: 131/69 mmHg (07/27 0625) Pulse Rate: 62 (07/27 0625)  Labs:  Recent Labs  02/09/15 0738  HGB 14.4  HCT 43.0  PLT 223  CREATININE 0.77    Estimated Creatinine Clearance: 74.2 mL/min (by C-G formula based on Cr of 0.77).   Medical History: Past Medical History  Diagnosis Date  . Hypertension   . Diabetes mellitus without complication   . Bipolar disorder   . Anxiety   . Headache   . High cholesterol   . Brain aneurysm   . PTSD (post-traumatic stress disorder)   . Dysrhythmia   . Stroke 05/2014    05/2014-TIA, TIA and CVA- 7 since 05/2014- memory loss and expressive aphasia. Left sided weakness  . COPD (chronic obstructive pulmonary disease)   . Shortness of breath dyspnea     with exertion  . IBS (irritable bowel syndrome)   . Pneumonia 2013 ish  . GERD (gastroesophageal reflux disease)   . UTI (lower urinary tract infection)   . Depression     Assessment: 14yoF s/p interventional radiology for middle cerebral artery stenosis on heparin. Prior to procedure H/H stable, Plts wnl. No s/sx of bleeding noted.  Goal of Therapy:  Heparin level 0.1-0.25 units/ml Monitor platelets by anticoagulation protocol: Yes   Plan:  -Heparin drip at 600 units/hr (~10units/kg/hr) -F/u 1900 HL -Monitor daily HL, CBC, and s/sx of bleeding  Dimitri Ped, PharmD. Clinical Pharmacist Resident Pager: [phone]

## 2015-02-10 ENCOUNTER — Encounter (HOSPITAL_COMMUNITY): Payer: Self-pay | Admitting: Interventional Radiology

## 2015-02-10 ENCOUNTER — Other Ambulatory Visit: Payer: Self-pay | Admitting: Radiology

## 2015-02-10 DIAGNOSIS — I771 Stricture of artery: Secondary | ICD-10-CM

## 2015-02-10 LAB — CBC WITH DIFFERENTIAL/PLATELET
BASOS ABS: 0 10*3/uL (ref 0.0–0.1)
Basophils Relative: 0 % (ref 0–1)
Eosinophils Absolute: 0.1 10*3/uL (ref 0.0–0.7)
Eosinophils Relative: 1 % (ref 0–5)
HCT: 38.4 % (ref 36.0–46.0)
Hemoglobin: 12.6 g/dL (ref 12.0–15.0)
LYMPHS PCT: 24 % (ref 12–46)
Lymphs Abs: 2.7 10*3/uL (ref 0.7–4.0)
MCH: 29.6 pg (ref 26.0–34.0)
MCHC: 32.8 g/dL (ref 30.0–36.0)
MCV: 90.1 fL (ref 78.0–100.0)
MONO ABS: 0.6 10*3/uL (ref 0.1–1.0)
Monocytes Relative: 5 % (ref 3–12)
NEUTROS PCT: 70 % (ref 43–77)
Neutro Abs: 8 10*3/uL — ABNORMAL HIGH (ref 1.7–7.7)
PLATELETS: 207 10*3/uL (ref 150–400)
RBC: 4.26 MIL/uL (ref 3.87–5.11)
RDW: 14.3 % (ref 11.5–15.5)
WBC: 11.6 10*3/uL — AB (ref 4.0–10.5)

## 2015-02-10 LAB — GLUCOSE, CAPILLARY
GLUCOSE-CAPILLARY: 86 mg/dL (ref 65–99)
Glucose-Capillary: 83 mg/dL (ref 65–99)

## 2015-02-10 LAB — BASIC METABOLIC PANEL
Anion gap: 5 (ref 5–15)
CHLORIDE: 108 mmol/L (ref 101–111)
CO2: 26 mmol/L (ref 22–32)
Calcium: 8.6 mg/dL — ABNORMAL LOW (ref 8.9–10.3)
Creatinine, Ser: 0.68 mg/dL (ref 0.44–1.00)
GFR calc Af Amer: >60 mL/min (ref >60)
Glucose, Bld: 78 mg/dL (ref 65–99)
Potassium: 3.8 mmol/L (ref 3.5–5.1)
Sodium: 139 mmol/L (ref 135–145)

## 2015-02-10 MED ORDER — ASPIRIN 325 MG PO TABS: 325.0000 mg | ORAL_TABLET | Freq: Every day | ORAL | Status: DC

## 2015-02-10 MED ORDER — METFORMIN HCL 500 MG PO TABS: 500.0000 mg | ORAL_TABLET | Freq: Two times a day (BID) | ORAL | Status: DC

## 2015-02-10 NOTE — Discharge Summary (Signed)
Patient ID: Jill Taylor MRN: [id number] DOB/AGE: 04-08-1966 49 y.o.  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  Hospital Course: Jill Taylor is a 49 y.o. female with history of previous cerebral aneurysm clipping in 2007, known CVA's and was referred to Dr. Estanislado Pandy for continued headaches and arteriogram done on 01/31/15 revealed approximately 75-80% stenosis of the left internal carotid artery supraclinoid segment extending into the left middle cerebral artery proximally with a narrowing of approximately 75%. Findings also include moderate narrowing at the origin of the hypoplastic left anterior cerebral artery A1 segment. Approximately 60% stenosis at the origin of the left vertebral artery, and 50-60% stenosis at the origin of the right vertebral artery.   The patient was scheduled for intervention and this was performed electively on 02/09/15 with Dr. Estanislado Pandy under general anesthesia. The patient is s/p left common carotid arteriogram followed by balloon angioplasty of LT ICA supraclinoid seg and Lt MCA M1 seg with residual stenosis of approximately 15%. She was extubated without difficulty and admitted overnight in Neuro ICU for close observation. She does admit to 5/10 left frontal pressure/headache that is intermittent and not associated with vision changes, nausea or vomiting. She denies any new extremity weakness, vision or speech changes. She denies any chest pain or shortness of breath. She denies any right groin site pain, swelling, bleeding or skin changes. She has had her foley catheter removed and voided without difficulty. She has tolerated a  regular diet without nausea or vomiting. She has ambulated the halls without lightheadedness or dizziness. Her labs and vitals are stable and she has received her ASA 325mg  and Plavix 75mg  today. She has been seen by Dr. Estanislado Pandy and deemed medically stable for discharge home. She was given the below instructions and will follow-up with [facility] in 2 weeks. She verbalizes understanding of the above plan.  Consults: Anesthesia.  Treatments: S/P left common carotid arteriogram followed by balloon angioplasty of LT ICA supraclinoid seg and Lt MCA M1 seg with residual stenosis of approximately 15%  Discharge Exam: Blood pressure 132/74, pulse 71, temperature 98 F (36.7 C), temperature source Oral, resp. rate 18, height 5\' 1"  (1.549 m), weight 154 lb 15.7 oz (70.3 kg), SpO2 97 %.  PE: General: A&Ox3, NAD, sitting up in bed eating Heart: RRR without M/G/R Lungs: CTA b/l Abd: Soft, NT, ND, (+) BS Ext: RCFA access dressing C/D/I, soft, minimal TTP, no signs of infection, bleeding or hematoma, DP 1+ b/l, warm b/l Neuro: Speech clear, smile symmetrical, tongue midline, EOMI, PERRLA, equal strength upper and lower extremities, fine motor intact, no ataxia   Disposition: 01-Home or Self Care  Discharge Instructions    Call MD for:  difficulty breathing, headache or visual disturbances    Complete by:  As directed      Call MD for:  extreme fatigue    Complete by:  As directed      Call MD for:  hives    Complete by:  As directed      Call MD for:  persistant dizziness or light-headedness    Complete by:  As directed      Call MD for:  persistant nausea and vomiting  Complete by:  As directed      Call MD for:  redness, tenderness, or signs of infection (pain, swelling, redness, odor or green/yellow discharge around incision site)    Complete by:  As directed      Call MD for:  severe uncontrolled pain    Complete by:  As directed      Call MD for:  temperature >100.4    Complete by:  As  directed      Diet - low sodium heart healthy    Complete by:  As directed      Driving Restrictions    Complete by:  As directed   No driving x 2 weeks     Increase activity slowly    Complete by:  As directed      Lifting restrictions    Complete by:  As directed   No lifting over 20 lbs x 2 weeks     Remove dressing in 24 hours    Complete by:  As directed             Medication List    STOP taking these medications        aspirin EC 81 MG tablet  Replaced by:  aspirin 325 MG tablet      TAKE these medications        albuterol 108 (90 BASE) MCG/ACT inhaler  Commonly known as:  PROVENTIL HFA;VENTOLIN HFA  Inhale 2 puffs into the lungs every 6 (six) hours as needed for wheezing or shortness of breath.     aspirin 325 MG tablet  Take 1 tablet (325 mg total) by mouth daily with breakfast.     atorvastatin 40 MG tablet  Commonly known as:  LIPITOR  Take 40 mg by mouth at bedtime.     BREO ELLIPTA 100-25 MCG/INH Aepb  Generic drug:  Fluticasone Furoate-Vilanterol  Inhale 1 puff into the lungs daily.     cetirizine 10 MG tablet  Commonly known as:  ZYRTEC  Take 10 mg by mouth daily.     clopidogrel 75 MG tablet  Commonly known as:  PLAVIX  Take 75 mg by mouth daily.     clotrimazole 1 % cream  Commonly known as:  LOTRIMIN  Apply 1 application topically at bedtime as needed (athlete's foot).     dicyclomine 20 MG tablet  Commonly known as:  BENTYL  Take 20 mg by mouth 2 (two) times daily with a meal.     gabapentin 600 MG tablet  Commonly known as:  NEURONTIN  Take 300 mg by mouth 3 (three) times daily.     glipiZIDE 5 MG tablet  Commonly known as:  GLUCOTROL  Take 10 mg by mouth 2 (two) times daily.     INVOKANA 300 MG Tabs tablet  Generic drug:  canagliflozin  Take 300 mg by mouth daily.     lisinopril 5 MG tablet  Commonly known as:  PRINIVIL,ZESTRIL  Take 5 mg by mouth daily.     Melatonin 10 MG Tabs  Take 10 mg by mouth at bedtime.      metFORMIN 500 MG tablet  Commonly known as:  GLUCOPHAGE  Take 1 tablet (500 mg total) by mouth 2 (two) times daily.  Start taking on:  02/11/2015     omeprazole 40 MG capsule  Commonly known as:  PRILOSEC  Take 40 mg by mouth 2 (two) times daily.     VIBERZI 75 MG Tabs  Generic drug:  Eluxadoline  Take 75 mg by mouth 2 (two) times daily.           Follow-up Information    Follow up with DEVESHWAR, Fritz Pickerel, MD In 2 weeks.   Specialty:  Interventional Radiology   Why:  Our office will call with appointment [phone]   Contact information:   [address] [place] [address] [phone]        Signed: Hedy Jacob 02/10/2015, 1:59 PM   I have spent Greater Than 30 Minutes discharging Jill Taylor.

## 2015-02-10 NOTE — Progress Notes (Signed)
Discharge instructions and teaching given to patient with family present.  No questions or concerns voiced.  Patient discharged home with family.

## 2015-02-23 ENCOUNTER — Other Ambulatory Visit: Payer: Self-pay | Admitting: Radiology

## 2015-02-23 ENCOUNTER — Other Ambulatory Visit (HOSPITAL_COMMUNITY): Payer: Self-pay | Admitting: Interventional Radiology

## 2015-02-23 ENCOUNTER — Ambulatory Visit (HOSPITAL_COMMUNITY): Admit: 2015-02-23 | Payer: Medicare Other

## 2015-02-23 ENCOUNTER — Ambulatory Visit (HOSPITAL_COMMUNITY)
Admission: RE | Admit: 2015-02-23 | Discharge: 2015-02-23 | Disposition: A | Discharge status: Home / Self Care | Payer: Medicare Other | Source: Ambulatory Visit | Attending: Interventional Radiology | Admitting: Interventional Radiology

## 2015-02-23 DIAGNOSIS — E119 Type 2 diabetes mellitus without complications: Secondary | ICD-10-CM | POA: Insufficient documentation

## 2015-02-23 DIAGNOSIS — Z7902 Long term (current) use of antithrombotics/antiplatelets: Secondary | ICD-10-CM | POA: Diagnosis not present

## 2015-02-23 DIAGNOSIS — I6602 Occlusion and stenosis of left middle cerebral artery: Secondary | ICD-10-CM | POA: Diagnosis not present

## 2015-02-23 DIAGNOSIS — I6522 Occlusion and stenosis of left carotid artery: Secondary | ICD-10-CM | POA: Insufficient documentation

## 2015-02-23 DIAGNOSIS — Z48812 Encounter for surgical aftercare following surgery on the circulatory system: Secondary | ICD-10-CM | POA: Diagnosis not present

## 2015-02-23 DIAGNOSIS — Z7982 Long term (current) use of aspirin: Secondary | ICD-10-CM | POA: Diagnosis not present

## 2015-02-23 DIAGNOSIS — I771 Stricture of artery: Secondary | ICD-10-CM

## 2015-02-23 DIAGNOSIS — F17210 Nicotine dependence, cigarettes, uncomplicated: Secondary | ICD-10-CM | POA: Insufficient documentation

## 2015-02-23 LAB — PLATELET INHIBITION P2Y12: PLATELET FUNCTION P2Y12: 3 [PRU] — AB (ref 194–418)

## 2015-03-10 ENCOUNTER — Telehealth (HOSPITAL_COMMUNITY): Payer: Self-pay | Admitting: *Deleted

## 2015-03-10 NOTE — Telephone Encounter (Signed)
Called and left message for patient. Left instructions on message for patient that Dr. Estanislado Pandy had reviewed her lab work and that she needs to to take 37.5mg  of Plavix daily, which is half a tablet.  Instructed patient to return in 2 weeks for a repeat P2Y12.  Instructed for her to call us back with concerns or questions.

## 2015-05-31 ENCOUNTER — Other Ambulatory Visit: Payer: Self-pay | Admitting: Radiology

## 2015-06-02 ENCOUNTER — Encounter (HOSPITAL_COMMUNITY): Payer: Self-pay

## 2015-06-02 ENCOUNTER — Ambulatory Visit (HOSPITAL_COMMUNITY)
Admission: RE | Admit: 2015-06-02 | Discharge: 2015-06-02 | Disposition: A | Discharge status: Home / Self Care | Payer: Medicare Other | Source: Ambulatory Visit | Attending: Interventional Radiology | Admitting: Interventional Radiology

## 2015-06-02 ENCOUNTER — Other Ambulatory Visit (HOSPITAL_COMMUNITY): Payer: Self-pay | Admitting: Interventional Radiology

## 2015-06-02 DIAGNOSIS — Z48812 Encounter for surgical aftercare following surgery on the circulatory system: Secondary | ICD-10-CM | POA: Insufficient documentation

## 2015-06-02 DIAGNOSIS — Z7902 Long term (current) use of antithrombotics/antiplatelets: Secondary | ICD-10-CM | POA: Diagnosis not present

## 2015-06-02 DIAGNOSIS — K219 Gastro-esophageal reflux disease without esophagitis: Secondary | ICD-10-CM | POA: Insufficient documentation

## 2015-06-02 DIAGNOSIS — I10 Essential (primary) hypertension: Secondary | ICD-10-CM | POA: Diagnosis not present

## 2015-06-02 DIAGNOSIS — E7800 Pure hypercholesterolemia, unspecified: Secondary | ICD-10-CM | POA: Diagnosis not present

## 2015-06-02 DIAGNOSIS — E119 Type 2 diabetes mellitus without complications: Secondary | ICD-10-CM | POA: Insufficient documentation

## 2015-06-02 DIAGNOSIS — F4310 Post-traumatic stress disorder, unspecified: Secondary | ICD-10-CM | POA: Diagnosis not present

## 2015-06-02 DIAGNOSIS — I771 Stricture of artery: Secondary | ICD-10-CM

## 2015-06-02 DIAGNOSIS — Z7982 Long term (current) use of aspirin: Secondary | ICD-10-CM | POA: Insufficient documentation

## 2015-06-02 DIAGNOSIS — F17210 Nicotine dependence, cigarettes, uncomplicated: Secondary | ICD-10-CM | POA: Insufficient documentation

## 2015-06-02 DIAGNOSIS — J449 Chronic obstructive pulmonary disease, unspecified: Secondary | ICD-10-CM | POA: Insufficient documentation

## 2015-06-02 DIAGNOSIS — K589 Irritable bowel syndrome without diarrhea: Secondary | ICD-10-CM | POA: Insufficient documentation

## 2015-06-02 DIAGNOSIS — I663 Occlusion and stenosis of cerebellar arteries: Secondary | ICD-10-CM | POA: Diagnosis not present

## 2015-06-02 DIAGNOSIS — F419 Anxiety disorder, unspecified: Secondary | ICD-10-CM | POA: Insufficient documentation

## 2015-06-02 DIAGNOSIS — I69320 Aphasia following cerebral infarction: Secondary | ICD-10-CM | POA: Diagnosis not present

## 2015-06-02 DIAGNOSIS — G4733 Obstructive sleep apnea (adult) (pediatric): Secondary | ICD-10-CM | POA: Diagnosis not present

## 2015-06-02 DIAGNOSIS — I69354 Hemiplegia and hemiparesis following cerebral infarction affecting left non-dominant side: Secondary | ICD-10-CM | POA: Diagnosis not present

## 2015-06-02 DIAGNOSIS — F319 Bipolar disorder, unspecified: Secondary | ICD-10-CM | POA: Diagnosis not present

## 2015-06-02 LAB — BASIC METABOLIC PANEL
ANION GAP: 8 (ref 5–15)
BUN: 9 mg/dL (ref 6–20)
CHLORIDE: 103 mmol/L (ref 101–111)
CO2: 25 mmol/L (ref 22–32)
CREATININE: 0.78 mg/dL (ref 0.44–1.00)
Calcium: 9 mg/dL (ref 8.9–10.3)
GFR calc Af Amer: >60 mL/min (ref >60)
GFR calc non Af Amer: >60 mL/min (ref >60)
GLUCOSE: 150 mg/dL — AB (ref 65–99)
POTASSIUM: 4.2 mmol/L (ref 3.5–5.1)
Sodium: 136 mmol/L (ref 135–145)

## 2015-06-02 LAB — CBC
HCT: 44.2 % (ref 36.0–46.0)
Hemoglobin: 14.6 g/dL (ref 12.0–15.0)
MCH: 28.7 pg (ref 26.0–34.0)
MCHC: 33 g/dL (ref 30.0–36.0)
MCV: 87 fL (ref 78.0–100.0)
PLATELETS: 180 10*3/uL (ref 150–400)
RBC: 5.08 MIL/uL (ref 3.87–5.11)
RDW: 14 % (ref 11.5–15.5)
WBC: 8 10*3/uL (ref 4.0–10.5)

## 2015-06-02 LAB — GLUCOSE, CAPILLARY: GLUCOSE-CAPILLARY: 162 mg/dL — AB (ref 65–99)

## 2015-06-02 LAB — APTT: APTT: 24 s (ref 24–37)

## 2015-06-02 LAB — PROTIME-INR
INR: 0.95 (ref 0.00–1.49)
Prothrombin Time: 12.9 seconds (ref 11.6–15.2)

## 2015-06-02 MED ORDER — SODIUM CHLORIDE 0.9 % IV SOLN
INTRAVENOUS | Status: AC | PRN
  Administered 2015-06-02: 10 mL/h via INTRAVENOUS

## 2015-06-02 MED ORDER — SODIUM CHLORIDE 0.9 % IV SOLN: INTRAVENOUS | Status: AC

## 2015-06-02 MED ORDER — SODIUM CHLORIDE 0.9 % IV SOLN
Freq: Once | INTRAVENOUS | Status: AC
Start: 2015-06-02 — End: 2015-06-02
  Administered 2015-06-02: 08:00:00 via INTRAVENOUS

## 2015-06-02 MED ORDER — IOHEXOL 300 MG/ML  SOLN
150.0000 mL | Freq: Once | INTRAMUSCULAR | Status: DC | PRN
  Administered 2015-06-02: 80 mL via INTRAVENOUS
  Filled 2015-06-02: qty 150

## 2015-06-02 MED ORDER — FENTANYL CITRATE (PF) 100 MCG/2ML IJ SOLN
INTRAMUSCULAR | Status: AC
  Filled 2015-06-02: qty 4

## 2015-06-02 MED ORDER — MIDAZOLAM HCL 2 MG/2ML IJ SOLN
INTRAMUSCULAR | Status: AC
  Filled 2015-06-02: qty 4

## 2015-06-02 MED ORDER — LIDOCAINE HCL 1 % IJ SOLN
INTRAMUSCULAR | Status: AC
  Filled 2015-06-02: qty 20

## 2015-06-02 MED ORDER — HEPARIN SOD (PORK) LOCK FLUSH 100 UNIT/ML IV SOLN
INTRAVENOUS | Status: AC
  Filled 2015-06-02: qty 20

## 2015-06-02 MED ORDER — HEPARIN SOD (PORK) LOCK FLUSH 100 UNIT/ML IV SOLN
INTRAVENOUS | Status: AC | PRN
  Administered 2015-06-02: 1000 [IU] via INTRAVENOUS
  Administered 2015-06-02: 500 [IU] via INTRAVENOUS

## 2015-06-02 MED ORDER — FENTANYL CITRATE (PF) 100 MCG/2ML IJ SOLN
INTRAMUSCULAR | Status: AC | PRN
  Administered 2015-06-02 (×2): 25 ug via INTRAVENOUS

## 2015-06-02 MED ORDER — MIDAZOLAM HCL 2 MG/2ML IJ SOLN
INTRAMUSCULAR | Status: AC | PRN
  Administered 2015-06-02: 1 mg via INTRAVENOUS

## 2015-06-02 NOTE — Sedation Documentation (Signed)
Patient denies pain and is resting comfortably.  

## 2015-06-02 NOTE — Sedation Documentation (Signed)
Patient is resting comfortably. 

## 2015-06-02 NOTE — Sedation Documentation (Signed)
Pt transported to ss room 2, Right groin assessed by receiving RN, dressing clean, dry, and intact. No hematoma noted to Right groin site.

## 2015-06-02 NOTE — Procedures (Signed)
S/P 4 vessel cerebral arteriogram. RT CFA approach. Findings. 1Approx 65 to 70 % stenosis of LT MCA prox. 2 aprrox 50 % stenosis lt VA origin. 3.No residual or recurrent aneurysm Rt MCA trifurcation

## 2015-06-02 NOTE — Discharge Instructions (Signed)
Angiogram, Care After °Refer to this sheet in the next few weeks. These instructions provide you with information about caring for yourself after your procedure. Your health care provider may also give you more specific instructions. Your treatment has been planned according to current medical practices, but problems sometimes occur. Call your health care provider if you have any problems or questions after your procedure. °WHAT TO EXPECT AFTER THE PROCEDURE °After your procedure, it is typical to have the following: °· Bruising at the catheter insertion site that usually fades within 1-2 weeks. °· Blood collecting in the tissue (hematoma) that may be painful to the touch. It should usually decrease in size and tenderness within 1-2 weeks. °HOME CARE INSTRUCTIONS °· Take medicines only as directed by your health care provider. °· You may shower 24-48 hours after the procedure or as directed by your health care provider. Remove the bandage (dressing) and gently wash the site with plain soap and water. Pat the area dry with a clean towel. Do not rub the site, because this may cause bleeding. °· Do not take baths, swim, or use a hot tub until your health care provider approves. °· Check your insertion site every day for redness, swelling, or drainage. °· Do not apply powder or lotion to the site. °· Do not lift over 10 lb (4.5 kg) for 5 days after your procedure or as directed by your health care provider. °· Ask your health care provider when it is okay to: °¨ Return to work or school. °¨ Resume usual physical activities or sports. °¨ Resume sexual activity. °· Do not drive home if you are discharged the same day as the procedure. Have someone else drive you. °· You may drive 24 hours after the procedure unless otherwise instructed by your health care provider. °· Do not operate machinery or power tools for 24 hours after the procedure or as directed by your health care provider. °· If your procedure was done as an  outpatient procedure, which means that you went home the same day as your procedure, a responsible adult should be with you for the first 24 hours after you arrive home. °· Keep all follow-up visits as directed by your health care provider. This is important. °SEEK MEDICAL CARE IF: °· You have a fever. °· You have chills. °· You have increased bleeding from the catheter insertion site. Hold pressure on the site and call 911. °SEEK IMMEDIATE MEDICAL CARE IF: °· You have unusual pain at the catheter insertion site. °· You have redness, warmth, or swelling at the catheter insertion site. °· You have drainage (other than a small amount of blood on the dressing) from the catheter insertion site. °· The catheter insertion site is bleeding, and the bleeding does not stop after 30 minutes of holding steady pressure on the site. °· The area near or just beyond the catheter insertion site becomes pale, cool, tingly, or numb. °  °This information is not intended to replace advice given to you by your health care provider. Make sure you discuss any questions you have with your health care provider. °  °Document Released: 01/18/2005 Document Revised: 07/23/2014 Document Reviewed: 12/03/2012 °Elsevier Interactive Patient Education ©2016 [employer] ° °

## 2015-06-02 NOTE — H&P (Signed)
History of Present Illness: Jill Taylor is a 49 y.o. female with history of left internal carotid artery supraclinoid angioplasty and left middle cerebral artery angioplasty. She has been scheduled today for follow-up cerebral arteriogram. She denies any new neurological symptoms. She does admit to occasional frontal and temporal headaches. She does admit to chronic blurry vision with no change. She is still smoking tobacco 1/2 ppd. She denies any chest pain, shortness of breath or palpitations. She denies any active signs of bleeding. She denies any recent fever or chills. The patient admits to OSA and does use a CPAP. She has previously tolerated sedation without complications.     Past Medical History  Diagnosis Date  . Hypertension   . Diabetes mellitus without complication ([place])   . Bipolar disorder ([place])   . Anxiety   . Headache   . High cholesterol   . Brain aneurysm   . PTSD (post-traumatic stress disorder)   . Dysrhythmia   . Stroke [facility]) 05/2014    05/2014-TIA, TIA and CVA- 7 since 05/2014- memory loss and expressive aphasia. Left sided weakness  . COPD (chronic obstructive pulmonary disease) ([place])   . Shortness of breath dyspnea     with exertion  . IBS (irritable bowel syndrome)   . Pneumonia 2013 ish  . GERD (gastroesophageal reflux disease)   . UTI (lower urinary tract infection)   . Depression     Past Surgical History  Procedure Laterality Date  . Brain surgery  2007  . Abdominal hysterectomy    . Cholecystectomy    . Tubal ligation    . Brain surgery    . Radiology with anesthesia N/A 02/09/2015    Procedure: ANGIOPLASTY;  Surgeon: Luanne Bras, MD;  Location: [place];  Service: Radiology;  Laterality: N/A;    Allergies: Other and Sulfa antibiotics  Medications: Prior to Admission medications   Medication Sig Start Date End Date Taking? Authorizing Provider  albuterol (PROVENTIL HFA;VENTOLIN HFA) 108 (90 BASE) MCG/ACT inhaler Inhale 2 puffs  into the lungs every 6 (six) hours as needed for wheezing or shortness of breath.   Yes Historical Provider, MD  aspirin 81 MG tablet Take 81 mg by mouth daily.   Yes Historical Provider, MD  atorvastatin (LIPITOR) 40 MG tablet Take 40 mg by mouth at bedtime. 01/11/15  Yes Historical Provider, MD  canagliflozin (INVOKANA) 300 MG TABS tablet Take 300 mg by mouth daily.   Yes Historical Provider, MD  cetirizine (ZYRTEC) 10 MG tablet Take 10 mg by mouth daily. 01/11/15  Yes Historical Provider, MD  clopidogrel (PLAVIX) 75 MG tablet Take 37.5 mg by mouth daily.  11/08/14  Yes Historical Provider, MD  clotrimazole (LOTRIMIN) 1 % cream Apply 1 application topically at bedtime as needed (athlete's foot).   Yes Historical Provider, MD  fesoterodine (TOVIAZ) 4 MG TB24 tablet Take 4 mg by mouth daily.   Yes Historical Provider, MD  Fluticasone Furoate-Vilanterol (BREO ELLIPTA) 100-25 MCG/INH AEPB Inhale 1 puff into the lungs daily.   Yes Historical Provider, MD  gabapentin (NEURONTIN) 600 MG tablet Take 1,200 mg by mouth 3 (three) times daily.  01/03/15  Yes Historical Provider, MD  glipiZIDE (GLUCOTROL) 5 MG tablet Take 5 mg by mouth 2 (two) times daily.  01/11/15  Yes Historical Provider, MD  lisinopril (PRINIVIL,ZESTRIL) 5 MG tablet Take 2.5 mg by mouth daily.  01/11/15  Yes Historical Provider, MD  Melatonin 10 MG TABS Take 10 mg by mouth at bedtime.   Yes  Historical Provider, MD  omeprazole (PRILOSEC) 40 MG capsule Take 40 mg by mouth 2 (two) times daily. 01/11/15  Yes Historical Provider, MD     History reviewed. No pertinent family history.  Social History   Social History  . Marital Status: Married    Spouse Name: N/A  . Number of Children: N/A  . Years of Education: N/A   Social History Main Topics  . Smoking status: Current Every Day Smoker  . Smokeless tobacco: Never Used  . Alcohol Use: None  . Drug Use: None  . Sexual Activity: Not Asked   Other Topics Concern  . None   Social History  Narrative   Review of Systems: A 12 point ROS discussed and pertinent positives are indicated in the HPI above.  All other systems are negative.  Review of Systems  Vital Signs: BP 132/84 mmHg  Pulse 74  Temp(Src) 98.7 F (37.1 C) (Oral)  Resp 18  Ht 5\' 1"  (1.549 m)  Wt 154 lb (69.854 kg)  BMI 29.11 kg/m2  SpO2 99%  Physical Exam  Constitutional: She is oriented to person, place, and time. No distress.  HENT:  Head: Normocephalic and atraumatic.  Cardiovascular: Normal rate and regular rhythm.  Exam reveals no gallop and no friction rub.   No murmur heard. Pulmonary/Chest: Effort normal and breath sounds normal. No respiratory distress. She has no wheezes. She has no rales.  Abdominal: Soft. Bowel sounds are normal. She exhibits no distension. There is no tenderness.  Neurological: She is alert and oriented to person, place, and time.  Smile symmetrical, tongue midline, EOMI, speech clear, strength equal strength upper and lower extremities 5/5, intact dorsiflexion and plantarflexion  Skin: Skin is warm and dry. She is not diaphoretic.    Mallampati Score:  MD Evaluation Airway: WNL Heart: WNL Abdomen: WNL Chest/ Lungs: WNL ASA  Classification: 3 Mallampati/Airway Score: Two  Imaging: No results found.  Labs:  CBC:  Recent Labs  01/27/15 0845 02/09/15 0738 02/10/15 0510 06/02/15 0733  WBC 9.2 9.0 11.6* 8.0  HGB 14.6 14.4 12.6 14.6  HCT 43.7 43.0 38.4 44.2  PLT 208 223 207 180    COAGS:  Recent Labs  01/27/15 0845 06/02/15 0733  INR 0.96 0.95  APTT 24 24    BMP:  Recent Labs  01/27/15 0845 02/09/15 0738 02/10/15 0510  NA 140 138 139  K 4.5 4.0 3.8  CL 102 103 108  CO2 29 26 26   GLUCOSE 129* 96 78  BUN 7 8 <5*  CALCIUM 9.4 8.9 8.6*  CREATININE 0.74 0.77 0.68  GFRNONAA >60 >60 >60  GFRAA >60 >60 >60    LIVER FUNCTION TESTS:  Recent Labs  02/09/15 0738  BILITOT 0.4  AST 14*  ALT 14  ALKPHOS 139*  PROT 6.1*  ALBUMIN 3.4*     Assessment and Plan: Left internal carotid artery supraclinoid angioplasty Left middle cerebral artery angioplasty Scheduled today for follow-up cerebral arteriogram with sedation The patient has been NPO, on ASA 81mg  and Plavix 37.5 mg daily, labs and vitals have been reviewed. Risks and Benefits discussed with the patient including, but not limited to bleeding, infection, vascular injury, contrast induced renal failure or stroke. All of the patient's questions were answered, patient is agreeable to proceed. Consent signed and in chart.    SignedHedy Jacob 06/02/2015, 8:29 AM

## 2015-09-01 ENCOUNTER — Other Ambulatory Visit (HOSPITAL_COMMUNITY): Payer: Self-pay | Admitting: Interventional Radiology

## 2015-09-01 DIAGNOSIS — I771 Stricture of artery: Secondary | ICD-10-CM

## 2015-10-27 ENCOUNTER — Encounter (HOSPITAL_COMMUNITY): Payer: Self-pay

## 2015-10-27 ENCOUNTER — Ambulatory Visit (HOSPITAL_COMMUNITY)
Admission: RE | Admit: 2015-10-27 | Discharge: 2015-10-27 | Disposition: A | Discharge status: Home / Self Care | Payer: Medicare Other | Source: Ambulatory Visit | Attending: Interventional Radiology | Admitting: Interventional Radiology

## 2015-10-27 ENCOUNTER — Ambulatory Visit (HOSPITAL_COMMUNITY): Payer: Medicare Other

## 2015-10-27 DIAGNOSIS — I771 Stricture of artery: Secondary | ICD-10-CM

## 2015-10-27 DIAGNOSIS — I6522 Occlusion and stenosis of left carotid artery: Secondary | ICD-10-CM | POA: Diagnosis not present

## 2015-10-27 DIAGNOSIS — I708 Atherosclerosis of other arteries: Secondary | ICD-10-CM | POA: Diagnosis not present

## 2015-10-27 DIAGNOSIS — I6503 Occlusion and stenosis of bilateral vertebral arteries: Secondary | ICD-10-CM | POA: Insufficient documentation

## 2015-10-27 MED ORDER — IOPAMIDOL (ISOVUE-370) INJECTION 76%
INTRAVENOUS | Status: AC
  Administered 2015-10-27: 50 mL
  Filled 2015-10-27: qty 50

## 2015-11-17 ENCOUNTER — Telehealth (HOSPITAL_COMMUNITY): Payer: Self-pay

## 2015-11-17 NOTE — Telephone Encounter (Signed)
Pt agreed to f/u in 4 months with a catheter angiogram with Dr. Estanislado Pandy. AW

## 2016-02-21 ENCOUNTER — Telehealth (HOSPITAL_COMMUNITY): Payer: Self-pay

## 2016-02-21 NOTE — Telephone Encounter (Signed)
Called to schedule 4 month f/u angiogram, left message for pt to return call. AW 

## 2016-03-05 ENCOUNTER — Inpatient Hospital Stay (HOSPITAL_COMMUNITY)
Admission: EM | Admit: 2016-03-05 | Discharge: 2016-03-07 | DRG: 066 | Disposition: A | Discharge status: Home / Self Care | Payer: Medicare Other | Attending: Family Medicine | Admitting: Family Medicine

## 2016-03-05 ENCOUNTER — Emergency Department (HOSPITAL_COMMUNITY): Payer: Medicare Other

## 2016-03-05 ENCOUNTER — Encounter (HOSPITAL_COMMUNITY): Payer: Self-pay | Admitting: Nurse Practitioner

## 2016-03-05 DIAGNOSIS — I6930 Unspecified sequelae of cerebral infarction: Secondary | ICD-10-CM

## 2016-03-05 DIAGNOSIS — Z79899 Other long term (current) drug therapy: Secondary | ICD-10-CM | POA: Diagnosis not present

## 2016-03-05 DIAGNOSIS — I69320 Aphasia following cerebral infarction: Secondary | ICD-10-CM | POA: Diagnosis not present

## 2016-03-05 DIAGNOSIS — E1151 Type 2 diabetes mellitus with diabetic peripheral angiopathy without gangrene: Secondary | ICD-10-CM | POA: Diagnosis present

## 2016-03-05 DIAGNOSIS — I6602 Occlusion and stenosis of left middle cerebral artery: Secondary | ICD-10-CM | POA: Diagnosis present

## 2016-03-05 DIAGNOSIS — Z882 Allergy status to sulfonamides status: Secondary | ICD-10-CM

## 2016-03-05 DIAGNOSIS — G43909 Migraine, unspecified, not intractable, without status migrainosus: Secondary | ICD-10-CM | POA: Diagnosis present

## 2016-03-05 DIAGNOSIS — F17210 Nicotine dependence, cigarettes, uncomplicated: Secondary | ICD-10-CM | POA: Diagnosis present

## 2016-03-05 DIAGNOSIS — Z7902 Long term (current) use of antithrombotics/antiplatelets: Secondary | ICD-10-CM

## 2016-03-05 DIAGNOSIS — I10 Essential (primary) hypertension: Secondary | ICD-10-CM | POA: Diagnosis present

## 2016-03-05 DIAGNOSIS — Z7984 Long term (current) use of oral hypoglycemic drugs: Secondary | ICD-10-CM

## 2016-03-05 DIAGNOSIS — R2990 Unspecified symptoms and signs involving the nervous system: Secondary | ICD-10-CM

## 2016-03-05 DIAGNOSIS — Z8679 Personal history of other diseases of the circulatory system: Secondary | ICD-10-CM

## 2016-03-05 DIAGNOSIS — E7800 Pure hypercholesterolemia, unspecified: Secondary | ICD-10-CM | POA: Diagnosis present

## 2016-03-05 DIAGNOSIS — I739 Peripheral vascular disease, unspecified: Secondary | ICD-10-CM

## 2016-03-05 DIAGNOSIS — E785 Hyperlipidemia, unspecified: Secondary | ICD-10-CM | POA: Diagnosis present

## 2016-03-05 DIAGNOSIS — Z91048 Other nonmedicinal substance allergy status: Secondary | ICD-10-CM

## 2016-03-05 DIAGNOSIS — J449 Chronic obstructive pulmonary disease, unspecified: Secondary | ICD-10-CM | POA: Diagnosis present

## 2016-03-05 DIAGNOSIS — R4702 Dysphasia: Secondary | ICD-10-CM | POA: Diagnosis present

## 2016-03-05 DIAGNOSIS — G9389 Other specified disorders of brain: Secondary | ICD-10-CM

## 2016-03-05 DIAGNOSIS — F419 Anxiety disorder, unspecified: Secondary | ICD-10-CM | POA: Diagnosis present

## 2016-03-05 DIAGNOSIS — G459 Transient cerebral ischemic attack, unspecified: Secondary | ICD-10-CM | POA: Diagnosis not present

## 2016-03-05 DIAGNOSIS — I639 Cerebral infarction, unspecified: Principal | ICD-10-CM | POA: Diagnosis present

## 2016-03-05 DIAGNOSIS — I63232 Cerebral infarction due to unspecified occlusion or stenosis of left carotid arteries: Secondary | ICD-10-CM | POA: Diagnosis not present

## 2016-03-05 DIAGNOSIS — R29701 NIHSS score 1: Secondary | ICD-10-CM | POA: Diagnosis present

## 2016-03-05 DIAGNOSIS — K219 Gastro-esophageal reflux disease without esophagitis: Secondary | ICD-10-CM | POA: Diagnosis present

## 2016-03-05 DIAGNOSIS — R490 Dysphonia: Secondary | ICD-10-CM | POA: Diagnosis present

## 2016-03-05 DIAGNOSIS — I6522 Occlusion and stenosis of left carotid artery: Secondary | ICD-10-CM | POA: Diagnosis present

## 2016-03-05 DIAGNOSIS — E1140 Type 2 diabetes mellitus with diabetic neuropathy, unspecified: Secondary | ICD-10-CM | POA: Diagnosis present

## 2016-03-05 DIAGNOSIS — R4781 Slurred speech: Secondary | ICD-10-CM | POA: Diagnosis present

## 2016-03-05 DIAGNOSIS — F17200 Nicotine dependence, unspecified, uncomplicated: Secondary | ICD-10-CM | POA: Diagnosis present

## 2016-03-05 DIAGNOSIS — F319 Bipolar disorder, unspecified: Secondary | ICD-10-CM | POA: Diagnosis present

## 2016-03-05 DIAGNOSIS — I6529 Occlusion and stenosis of unspecified carotid artery: Secondary | ICD-10-CM | POA: Diagnosis present

## 2016-03-05 DIAGNOSIS — R479 Unspecified speech disturbances: Secondary | ICD-10-CM

## 2016-03-05 DIAGNOSIS — Z7982 Long term (current) use of aspirin: Secondary | ICD-10-CM | POA: Diagnosis not present

## 2016-03-05 DIAGNOSIS — F4310 Post-traumatic stress disorder, unspecified: Secondary | ICD-10-CM | POA: Diagnosis present

## 2016-03-05 DIAGNOSIS — Z90710 Acquired absence of both cervix and uterus: Secondary | ICD-10-CM

## 2016-03-05 LAB — COMPREHENSIVE METABOLIC PANEL
ALT: 22 U/L (ref 14–54)
ANION GAP: 9 (ref 5–15)
AST: 20 U/L (ref 15–41)
Albumin: 4 g/dL (ref 3.5–5.0)
Alkaline Phosphatase: 145 U/L — ABNORMAL HIGH (ref 38–126)
BILIRUBIN TOTAL: 0.9 mg/dL (ref 0.3–1.2)
BUN: 12 mg/dL (ref 6–20)
CO2: 22 mmol/L (ref 22–32)
Calcium: 9.2 mg/dL (ref 8.9–10.3)
Chloride: 102 mmol/L (ref 101–111)
Creatinine, Ser: 0.88 mg/dL (ref 0.44–1.00)
GFR calc Af Amer: >60 mL/min (ref >60)
Glucose, Bld: 219 mg/dL — ABNORMAL HIGH (ref 65–99)
POTASSIUM: 4 mmol/L (ref 3.5–5.1)
Sodium: 133 mmol/L — ABNORMAL LOW (ref 135–145)
TOTAL PROTEIN: 7 g/dL (ref 6.5–8.1)

## 2016-03-05 LAB — GLUCOSE, CAPILLARY: Glucose-Capillary: 221 mg/dL — ABNORMAL HIGH (ref 65–99)

## 2016-03-05 LAB — DIFFERENTIAL
Basophils Absolute: 0 10*3/uL (ref 0.0–0.1)
Basophils Relative: 0 %
EOS ABS: 0.3 10*3/uL (ref 0.0–0.7)
EOS PCT: 3 %
LYMPHS ABS: 2.5 10*3/uL (ref 0.7–4.0)
Lymphocytes Relative: 26 %
MONO ABS: 0.6 10*3/uL (ref 0.1–1.0)
MONOS PCT: 6 %
Neutro Abs: 6.5 10*3/uL (ref 1.7–7.7)
Neutrophils Relative %: 65 %

## 2016-03-05 LAB — CBC
HEMATOCRIT: 47 % — AB (ref 36.0–46.0)
HEMOGLOBIN: 15.1 g/dL — AB (ref 12.0–15.0)
MCH: 28.5 pg (ref 26.0–34.0)
MCHC: 32.1 g/dL (ref 30.0–36.0)
MCV: 88.7 fL (ref 78.0–100.0)
Platelets: 202 10*3/uL (ref 150–400)
RBC: 5.3 MIL/uL — ABNORMAL HIGH (ref 3.87–5.11)
RDW: 15.5 % (ref 11.5–15.5)
WBC: 9.9 10*3/uL (ref 4.0–10.5)

## 2016-03-05 LAB — I-STAT CHEM 8, ED
BUN: 14 mg/dL (ref 6–20)
CALCIUM ION: 1.04 mmol/L — AB (ref 1.13–1.30)
CREATININE: 0.7 mg/dL (ref 0.44–1.00)
Chloride: 100 mmol/L — ABNORMAL LOW (ref 101–111)
Glucose, Bld: 222 mg/dL — ABNORMAL HIGH (ref 65–99)
HCT: 48 % — ABNORMAL HIGH (ref 36.0–46.0)
HEMOGLOBIN: 16.3 g/dL — AB (ref 12.0–15.0)
Potassium: 3.9 mmol/L (ref 3.5–5.1)
Sodium: 135 mmol/L (ref 135–145)
TCO2: 22 mmol/L (ref 0–100)

## 2016-03-05 LAB — APTT: aPTT: 24 seconds (ref 24–36)

## 2016-03-05 LAB — I-STAT TROPONIN, ED: TROPONIN I, POC: 0.01 ng/mL (ref 0.00–0.08)

## 2016-03-05 LAB — PROTIME-INR
INR: 0.96
Prothrombin Time: 12.8 seconds (ref 11.4–15.2)

## 2016-03-05 LAB — CBG MONITORING, ED: GLUCOSE-CAPILLARY: 202 mg/dL — AB (ref 65–99)

## 2016-03-05 MED ORDER — INSULIN ASPART 100 UNIT/ML ~~LOC~~ SOLN
0.0000 [IU] | Freq: Every day | SUBCUTANEOUS | Status: DC
  Administered 2016-03-05: 2 [IU] via SUBCUTANEOUS

## 2016-03-05 MED ORDER — ENOXAPARIN SODIUM 40 MG/0.4ML ~~LOC~~ SOLN
40.0000 mg | SUBCUTANEOUS | Status: DC
  Filled 2016-03-05: qty 0.4

## 2016-03-05 MED ORDER — BUSPIRONE HCL 10 MG PO TABS
10.0000 mg | ORAL_TABLET | Freq: Two times a day (BID) | ORAL | Status: DC
  Administered 2016-03-05 – 2016-03-07 (×3): 10 mg via ORAL
  Filled 2016-03-05 (×3): qty 1

## 2016-03-05 MED ORDER — ATORVASTATIN CALCIUM 40 MG PO TABS
40.0000 mg | ORAL_TABLET | Freq: Every day | ORAL | Status: DC
  Administered 2016-03-05: 40 mg via ORAL
  Filled 2016-03-05: qty 1

## 2016-03-05 MED ORDER — PREGABALIN 100 MG PO CAPS
200.0000 mg | ORAL_CAPSULE | Freq: Three times a day (TID) | ORAL | Status: DC
  Administered 2016-03-05 – 2016-03-07 (×5): 200 mg via ORAL
  Filled 2016-03-05 (×5): qty 2

## 2016-03-05 MED ORDER — ASPIRIN EC 81 MG PO TBEC
81.0000 mg | DELAYED_RELEASE_TABLET | Freq: Every day | ORAL | Status: DC
  Administered 2016-03-06 – 2016-03-07 (×2): 81 mg via ORAL
  Filled 2016-03-05 (×2): qty 1

## 2016-03-05 MED ORDER — MAGNESIUM OXIDE 400 (241.3 MG) MG PO TABS
200.0000 mg | ORAL_TABLET | Freq: Three times a day (TID) | ORAL | Status: DC
  Administered 2016-03-05 – 2016-03-07 (×5): 200 mg via ORAL
  Filled 2016-03-05 (×5): qty 1

## 2016-03-05 MED ORDER — QUETIAPINE FUMARATE 25 MG PO TABS
25.0000 mg | ORAL_TABLET | Freq: Two times a day (BID) | ORAL | Status: DC
  Administered 2016-03-05 – 2016-03-07 (×3): 25 mg via ORAL
  Filled 2016-03-05 (×3): qty 1

## 2016-03-05 MED ORDER — FLUTICASONE FUROATE-VILANTEROL 100-25 MCG/INH IN AEPB
2.0000 | INHALATION_SPRAY | Freq: Every day | RESPIRATORY_TRACT | Status: DC
Start: 2016-03-06 — End: 2016-03-07
  Administered 2016-03-06 – 2016-03-07 (×2): 2 via RESPIRATORY_TRACT
  Filled 2016-03-05: qty 28

## 2016-03-05 MED ORDER — IOPAMIDOL (ISOVUE-370) INJECTION 76%
INTRAVENOUS | Status: AC
  Administered 2016-03-05: 50 mL
  Filled 2016-03-05: qty 50

## 2016-03-05 MED ORDER — PANTOPRAZOLE SODIUM 40 MG PO TBEC
80.0000 mg | DELAYED_RELEASE_TABLET | Freq: Every day | ORAL | Status: DC
Start: 2016-03-06 — End: 2016-03-07
  Administered 2016-03-06 – 2016-03-07 (×2): 80 mg via ORAL
  Filled 2016-03-05 (×2): qty 2

## 2016-03-05 MED ORDER — CLOPIDOGREL BISULFATE 75 MG PO TABS
75.0000 mg | ORAL_TABLET | Freq: Every day | ORAL | Status: DC
  Administered 2016-03-06 – 2016-03-07 (×2): 75 mg via ORAL
  Filled 2016-03-05 (×2): qty 1

## 2016-03-05 MED ORDER — NORTRIPTYLINE HCL 25 MG PO CAPS
25.0000 mg | ORAL_CAPSULE | Freq: Two times a day (BID) | ORAL | Status: DC
  Administered 2016-03-05 – 2016-03-07 (×3): 25 mg via ORAL
  Filled 2016-03-05 (×3): qty 1

## 2016-03-05 MED ORDER — STROKE: EARLY STAGES OF RECOVERY BOOK
Freq: Once | Status: AC
  Administered 2016-03-05: 17:00:00

## 2016-03-05 MED ORDER — LORATADINE 10 MG PO TABS
10.0000 mg | ORAL_TABLET | Freq: Every day | ORAL | Status: DC
  Administered 2016-03-06 – 2016-03-07 (×2): 10 mg via ORAL
  Filled 2016-03-05 (×2): qty 1

## 2016-03-05 MED ORDER — SENNOSIDES-DOCUSATE SODIUM 8.6-50 MG PO TABS: 1.0000 | ORAL_TABLET | Freq: Every evening | ORAL | Status: DC | PRN

## 2016-03-05 MED ORDER — INSULIN ASPART 100 UNIT/ML ~~LOC~~ SOLN: 0.0000 [IU] | Freq: Three times a day (TID) | SUBCUTANEOUS | Status: DC

## 2016-03-05 NOTE — H&P (Signed)
[facility] Admission History and Physical Service Pager: [phone]  Patient name: Jill Taylor Medical record number: [id number] Date of birth: 08/02/1965 Age: 50 y.o. Gender: female  Primary Care Provider: Imagene Riches, NP Consultants: neurology Code Status: FULL  Chief Complaint: slurred speech and worsened expressive aphasia.   Assessment and Plan: Jill Taylor is a 50 y.o. female presenting with slurred speech and worsened expressive aphasia. 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   Slurred Speech/Worsened Expressive Aphasia: symptoms started 8/16 and are improving. CTA head and neck in the ED showed no acute intracranial abnormality. Also showed stable findings of high grade stenosis of L ICA terminus, Left MCA and ACA origins; diminutive but patient L MCA M1 segment; stable R MCA bifurcation aneurysm clipping; mid-to-mod proximal right subclavian and left greater than right vertebral artery origin stenosis; age advanced soft and calcified atherosclerotic plaque in head and neck.  Has RF: hx of CVA and TIA, DM, HLD, HTN, tobacco use.  - admit to telemetry, attending Dr. McDiarmid - neuro checks per protocol - MRI brain  - ECHO - neurology consulted, appreciate reccs: request angioplasty 8/22 unless MRI negative (NPO at midnight)  - passed swallow screen: heart healthy carb mod diet - continue ASA and Plavix - continue Lipitor 40mg   - lipid panel and hemoglobin A1c ordered   DM2: no recent A1c on file (last A1c 7 in 10/2014). Home meds: Invokana, Glipizide, Trulicity  - [id number] ordered - CBGs ACHS with Mod SSI - holding home anti-hyperglycemic meds - continue home  Lyrica 200mg  TID   Anxiety/Depression/Bipolar DO: Takes Klonopin PRN and has not needed recently - continue home meds: Buspar 10mg  BID, Seroquel 25mg  BID -  Will hold home Klonopin for now and start if needed   HTN: Home med: Lisinopril 2.5mg  daily. Normotensive.  - holding Lisinopril  HLD:  - Lipitor  Migraines:  - continue home Notryptyline 25mg  daily  - continue home Magnesium 250mg  TID   GERD: - home Prilosec  COPD: stable  - home Breo   FEN/GI: heart healthy carb mod > NPO at midnight, SLIV Prophylaxis: Lovenox   Disposition: admit for further evaluation   History of Present Illness:  Jill Taylor is a 50 y.o. female presenting with slurred speech and expressive aphasia since Wednesday (8/16). Patient called her PCP today and reported of symptoms; she was told to go to the ED for further evaluation. Patient denies any other neurological deficits such as blurred vision/visual changes, facial drooping, new upper or lower extremity weakness (reports he has some residual left extremity weakness from prior CVA), or parasthesias. Reports her slurred speech and expressive aphasia is improving but not back at baseline. Denies any chest pain, sob, abdominal pain. Reports normal PO intake.   Of note, patient was admitted to [facility] in 10/2014 for inability to speak. Her preserved language understanding and production with the inability to speak, was not consistent with an organic neurologic etiology. Neurology did discuss with her the likely functional etiology of her transient deficit. At that time she did report that she had recently been under a great deal of stress.   Review Of Systems: Per HPI   Otherwise the remainder of the systems were negative.  Patient Active Problem  List   Diagnosis Date Noted  . Stroke ([place]) 03/05/2016  . 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 High cholesterol   . Brain aneurysm   . PTSD (post-traumatic stress disorder)   . Carotid stenosis      Past Medical History: Past Medical History:  Diagnosis Date  . Anxiety   . Bipolar disorder ([place])   . Brain aneurysm   . COPD (chronic obstructive pulmonary disease) ([place])   . Depression   . Diabetes mellitus without complication ([place])   . Dysrhythmia   . GERD (gastroesophageal reflux disease)   . Headache   . High cholesterol   . Hypertension   . IBS (irritable bowel syndrome)   . Pneumonia 2013 ish  . PTSD (post-traumatic stress disorder)   . Shortness of breath dyspnea    with exertion  . Stroke [facility]) 05/2014   05/2014-TIA, TIA and CVA- 7 since 05/2014- memory loss and expressive aphasia. Left sided weakness  . UTI (lower urinary tract infection)     Past Surgical History: Past Surgical History:  Procedure Laterality Date  . ABDOMINAL HYSTERECTOMY    . BRAIN SURGERY  2007  . BRAIN SURGERY    . CHOLECYSTECTOMY    . RADIOLOGY WITH ANESTHESIA N/A 02/09/2015   Procedure: ANGIOPLASTY;  Surgeon: Luanne Bras, MD;  Location: [facility];  Service: Radiology;  Laterality: N/A;  . TUBAL LIGATION      Social History: Additional social history: tobacco use: 1 PPD x over 20 yrs; Alcohol use: rare, denies illicit drug use  Please also refer to relevant sections of EMR.  Family History: History of brain aneurysms   Allergies and Medications: Allergies  Allergen Reactions  . Other Shortness Of Breath    Allergy to pine trees, hickory trees and mulberry trees & 2 types of fungus   . Sulfa Antibiotics Hives   No current facility-administered medications on file prior to encounter.    Current Outpatient Prescriptions on File Prior to Encounter  Medication Sig Dispense Refill  . albuterol (PROVENTIL HFA;VENTOLIN HFA) 108 (90 BASE) MCG/ACT inhaler Inhale 2 puffs into the lungs every 6 (six) hours as needed for wheezing or shortness of breath.    Marland Kitchen aspirin 81 MG tablet Take 81 mg by mouth daily.    Marland Kitchen atorvastatin (LIPITOR) 40 MG tablet Take 40 mg by mouth at bedtime.  2  .  canagliflozin (INVOKANA) 300 MG TABS tablet Take 300 mg by mouth daily.    . cetirizine (ZYRTEC) 10 MG tablet Take 10 mg by mouth daily.  3  . clopidogrel (PLAVIX) 75 MG tablet Take 75 mg by mouth daily.   11  . Fluticasone Furoate-Vilanterol (BREO ELLIPTA) 100-25 MCG/INH AEPB Inhale 2 puffs into the lungs daily.     Marland Kitchen lisinopril (PRINIVIL,ZESTRIL) 5 MG tablet Take 2.5 mg by mouth daily.   1  . Melatonin 10 MG TABS Take 10 mg by mouth at bedtime.    Marland Kitchen omeprazole (PRILOSEC) 40 MG capsule Take 40 mg by mouth 2 (two) times daily.  7  . clotrimazole (LOTRIMIN) 1 % cream Apply 1 application topically at bedtime as needed (athlete's foot).      Objective: BP 135/81   Pulse 68   Temp 98.1 F (36.7 C) (Oral)   Resp 22   SpO2 98%  Exam: GEN: NAD HEENT: Atraumatic, normocephalic, neck supple, EOMI, sclera clear Neck: no carotid bruits  CV: RRR, no murmurs, rubs, or gallops PULM: CTAB, normal effort ABD: Soft, nontender, nondistended, NABS,  no organomegaly SKIN: No rash or cyanosis; warm and well-perfused EXTR: No lower extremity edema or calf tenderness PSYCH: Mood and affect euthymic NEURO: Awake, alert, slurred speech per daughter, expressive aphasia noted intermittently (not baseline). Strength: 5/5 bilateral upper extremities, 5/5 RLE, 4-5/5 LLE. Normal sensation to light touch. CN 3-12 normal (visual fields not tested). Cerebellar- finger to nose wnl  Labs and Imaging: CBC BMET   Recent Labs Lab 03/05/16 1308 03/05/16 1331  WBC 9.9  --   HGB 15.1* 16.3*  HCT 47.0* 48.0*  PLT 202  --     Recent Labs Lab 03/05/16 1308 03/05/16 1331  NA 133* 135  K 4.0 3.9  CL 102 100*  CO2 22  --   BUN 12 14  CREATININE 0.88 0.70  GLUCOSE 219* 222*  CALCIUM 9.2  --      EKG: NSR, no acute changes  Tropi:  0.01  INR: 0.96 APTT: 24  CTA Head and Neck:  IMPRESSION: 1. Negative for emergent large vessel occlusion. 2. Stable CTA head and neck findings since April remarkable for -  high-grade stenosis Left ICA terminus, Left MCA and ACA origins. - diminutive but patent Left MCA M1 segment. Left ACA A2 and distal branches satisfactorily supplied by the anterior communicating artery. - stable CT appearance of Right MCA bifurcation aneurysm clipping. - mild-to-moderate proximal right subclavian and left greater than right vertebral artery origin stenoses. - age advanced soft and calcified atherosclerotic plaque in the head and neck.  3. Stable CT appearance of the brain. No acute intracranial abnormality identified.   Smiley Houseman, MD 03/05/2016, 4:28 PM PGY-2, [place] Intern pager: [phone], text pages welcome

## 2016-03-05 NOTE — ED Triage Notes (Signed)
Pt reports intermittent episodes of slurred speech, headaches, blurred vision, l sided weakness since Wednesday. Speech is comprehensible but slow today, which she reports has been intermittent since she woke this morning.L grip is weaker than right, no arm drift, seh reports this is normal for her. She has history of stroke, tia, brain aneurysm. She is alert, breathing easily.

## 2016-03-05 NOTE — ED Notes (Signed)
Pt to be taken upstairs by Lovena Le, EMT at 1620 per 73M request

## 2016-03-05 NOTE — Evaluation (Signed)
Clinical/Bedside Swallow Evaluation Patient Details  Name: Jill Taylor MRN: [id number] Date of Birth: 06-Apr-1966  Today's Date: 03/05/2016 Time: SLP Start Time (ACUTE ONLY): 23 SLP Stop Time (ACUTE ONLY): 1550 SLP Time Calculation (min) (ACUTE ONLY): 10 min  Past Medical History:  Past Medical History:  Diagnosis Date  . Anxiety   . Bipolar disorder ([place])   . Brain aneurysm   . COPD (chronic obstructive pulmonary disease) ([place])   . Depression   . Diabetes mellitus without complication ([place])   . Dysrhythmia   . GERD (gastroesophageal reflux disease)   . Headache   . High cholesterol   . Hypertension   . IBS (irritable bowel syndrome)   . Pneumonia 2013 ish  . PTSD (post-traumatic stress disorder)   . Shortness of breath dyspnea    with exertion  . Stroke [facility]) 05/2014   05/2014-TIA, TIA and CVA- 7 since 05/2014- memory loss and expressive aphasia. Left sided weakness  . UTI (lower urinary tract infection)    Past Surgical History:  Past Surgical History:  Procedure Laterality Date  . ABDOMINAL HYSTERECTOMY    . BRAIN SURGERY  2007  . BRAIN SURGERY    . CHOLECYSTECTOMY    . RADIOLOGY WITH ANESTHESIA N/A 02/09/2015   Procedure: ANGIOPLASTY;  Surgeon: Luanne Bras, MD;  Location: [facility];  Service: Radiology;  Laterality: N/A;  . TUBAL LIGATION     HPI:  50 y.o.femalewith intermittent and now persistent symptoms of expressive difficulty with speech and the patient with angioma gram of neck showing 65-70% restenosis of the site of previously angioplasty left middle cerebral artery and supraclinoid left ICA back in 10/27/2015   Assessment / Plan / Recommendation Clinical Impression  Pt presents with normal oropharyngeal swallow with adequate mastication despite absence of teeth, brisk swallow response, and no s/s of aspiration.  Recommend resuming a normal diet, thin liquids, no f/u.      Aspiration Risk  No limitations    Diet Recommendation   regular solids,  thin liquids  Medication Administration: Whole meds with liquid    Other  Recommendations Oral Care Recommendations: Oral care BID   Follow up Recommendations  None    Frequency and Duration            Prognosis        Swallow Study   General Date of Onset: 03/05/16 HPI: 50 y.o.femalewith intermittent and now persistent symptoms of expressive difficulty with speech and the patient with angioma gram of neck showing 65-70% restenosis of the site of previously angioplasty left middle cerebral artery and supraclinoid left ICA back in 10/27/2015 Type of Study: Bedside Swallow Evaluation Previous Swallow Assessment: none Diet Prior to this Study: NPO Temperature Spikes Noted: No Respiratory Status: Room air History of Recent Intubation: No Behavior/Cognition: Alert;Cooperative;Pleasant mood Oral Cavity Assessment: Within Functional Limits Oral Care Completed by SLP: No Oral Cavity - Dentition: Edentulous Vision: Functional for self-feeding Self-Feeding Abilities: Able to feed self Patient Positioning: Upright in bed Baseline Vocal Quality: Normal Volitional Cough: Strong Volitional Swallow: Able to elicit    Oral/Motor/Sensory Function Overall Oral Motor/Sensory Function: Within functional limits   Ice Chips Ice chips: Within functional limits Presentation: Self Fed   Thin Liquid Thin Liquid: Within functional limits Presentation: Cup;Straw    Nectar Thick Nectar Thick Liquid: Not tested   Honey Thick Honey Thick Liquid: Not tested   Puree Puree: Within functional limits   Solid   GO   Solid: Within functional limits  Juan Quam Laurice 03/05/2016,5:20 PM

## 2016-03-05 NOTE — Consult Note (Signed)
Requesting Physician: Dr. Alvino Chapel    Chief Complaint: Expressive aphasia  History obtained from:  Patient     HPI:                                                                                                                                         Jill Taylor is an 50 y.o. female who was seen last year on 02/09/2015 in which she had a left ICA supraclinoid clinoid balloon angioplasty. Patient and patient's daughter state after the angioplasty her speech seemed to improve. At times she would have intermittent expressive aphasia. And awoke this past Wednesday and noted that her expressive aphasia was worse. As what she wants to say but at times has hard time getting her words out. Over the last few days it has progressively worsened and now is consistent without intermittent periods. Currently her only complaint is at times difficulty saying the word that she would like to say. She denies any slurred speech, headache, numbness, tingling, weakness.   Date last known well: Date: 02/29/2016 Time last known well: Unable to determine tPA Given: No: out of window   Past Medical History:  Diagnosis Date  . Anxiety   . Bipolar disorder ([place])   . Brain aneurysm   . COPD (chronic obstructive pulmonary disease) ([employer])   . Depression   . Diabetes mellitus without complication ([place])   . Dysrhythmia   . GERD (gastroesophageal reflux disease)   . Headache   . High cholesterol   . Hypertension   . IBS (irritable bowel syndrome)   . Pneumonia 2013 ish  . PTSD (post-traumatic stress disorder)   . Shortness of breath dyspnea    with exertion  . Stroke [facility]) 05/2014   05/2014-TIA, TIA and CVA- 7 since 05/2014- memory loss and expressive aphasia. Left sided weakness  . UTI (lower urinary tract infection)     Past Surgical History:  Procedure Laterality Date  . ABDOMINAL HYSTERECTOMY    . BRAIN SURGERY  2007  . BRAIN SURGERY    . CHOLECYSTECTOMY    . RADIOLOGY WITH ANESTHESIA N/A 02/09/2015    Procedure: ANGIOPLASTY;  Surgeon: Luanne Bras, MD;  Location: [place];  Service: Radiology;  Laterality: N/A;  . TUBAL LIGATION      History reviewed. No pertinent family history. Social History:  reports that she has been smoking Cigarettes.  She has never used smokeless tobacco. She reports that she does not drink alcohol or use drugs.  Allergies:  Allergies  Allergen Reactions  . Other Shortness Of Breath    Allergy to pine trees, hickory trees and mulberry trees & 2 types of fungus   . Sulfa Antibiotics Hives    Medications:  No current facility-administered medications for this encounter.    Current Outpatient Prescriptions  Medication Sig Dispense Refill  . ACETAMINOPHEN-BUTALBITAL 50-325 MG TABS Take 2 tablets by mouth daily as needed (for headache or migraine).     Marland Kitchen albuterol (PROVENTIL HFA;VENTOLIN HFA) 108 (90 BASE) MCG/ACT inhaler Inhale 2 puffs into the lungs every 6 (six) hours as needed for wheezing or shortness of breath.    Marland Kitchen aspirin 81 MG tablet Take 81 mg by mouth daily.    Marland Kitchen atorvastatin (LIPITOR) 40 MG tablet Take 40 mg by mouth at bedtime.  2  . Black Cohosh 40 MG CAPS Take 40 mg by mouth 2 (two) times daily.    . busPIRone (BUSPAR) 10 MG tablet Take 10 mg by mouth 2 (two) times daily.     . canagliflozin (INVOKANA) 300 MG TABS tablet Take 300 mg by mouth daily.    . cetirizine (ZYRTEC) 10 MG tablet Take 10 mg by mouth daily.  3  . clonazePAM (KLONOPIN) 0.5 MG tablet Take 0.5 mg by mouth 2 (two) times daily.     . clopidogrel (PLAVIX) 75 MG tablet Take 75 mg by mouth daily.   11  . Dulaglutide (TRULICITY) 1.5 0000000 SOPN Inject 1.5 mg into the skin once a week.    . Fluticasone Furoate-Vilanterol (BREO ELLIPTA) 100-25 MCG/INH AEPB Inhale 2 puffs into the lungs daily.     Marland Kitchen glipiZIDE (GLUCOTROL XL) 10 MG 24 hr tablet Take 10 mg by  mouth 2 (two) times daily.    Marland Kitchen ipratropium-albuterol (DUONEB) 0.5-2.5 (3) MG/3ML SOLN Take 3 mLs by nebulization every 6 (six) hours as needed (for shortness of breath).    Marland Kitchen lisinopril (PRINIVIL,ZESTRIL) 5 MG tablet Take 2.5 mg by mouth daily.   1  . loperamide (IMODIUM) 2 MG capsule Take 2 mg by mouth as needed for diarrhea or loose stools.    . Magnesium 250 MG TABS Take 250 mg by mouth 3 (three) times daily.    . Melatonin 10 MG TABS Take 10 mg by mouth at bedtime.    . nortriptyline (PAMELOR) 25 MG capsule Take 25 mg by mouth 2 (two) times daily.    Marland Kitchen omeprazole (PRILOSEC) 40 MG capsule Take 40 mg by mouth 2 (two) times daily.  7  . polyethylene glycol (MIRALAX / GLYCOLAX) packet Take 17 g by mouth daily as needed for moderate constipation.    . pregabalin (LYRICA) 200 MG capsule Take 200 mg by mouth 3 (three) times daily.    . QUEtiapine (SEROQUEL) 25 MG tablet Take 25 mg by mouth 2 (two) times daily.     . traMADol (ULTRAM) 50 MG tablet Take 50 mg by mouth 2 (two) times daily as needed for moderate pain.    . clotrimazole (LOTRIMIN) 1 % cream Apply 1 application topically at bedtime as needed (athlete's foot).       ROS:  History obtained from the patient  General ROS: negative for - chills, fatigue, fever, night sweats, weight gain or weight loss Psychological ROS: negative for - behavioral disorder, hallucinations, memory difficulties, mood swings or suicidal ideation Ophthalmic ROS: negative for - blurry vision, double vision, eye pain or loss of vision ENT ROS: negative for - epistaxis, nasal discharge, oral lesions, sore throat, tinnitus or vertigo Allergy and Immunology ROS: negative for - hives or itchy/watery eyes Hematological and Lymphatic ROS: negative for - bleeding problems, bruising or swollen lymph nodes Endocrine ROS: negative for -  galactorrhea, hair pattern changes, polydipsia/polyuria or temperature intolerance Respiratory ROS: negative for - cough, hemoptysis, shortness of breath or wheezing Cardiovascular ROS: negative for - chest pain, dyspnea on exertion, edema or irregular heartbeat Gastrointestinal ROS: negative for - abdominal pain, diarrhea, hematemesis, nausea/vomiting or stool incontinence Genito-Urinary ROS: negative for - dysuria, hematuria, incontinence or urinary frequency/urgency Musculoskeletal ROS: negative for - joint swelling or muscular weakness Neurological ROS: as noted in HPI Dermatological ROS: negative for rash and skin lesion changes  Neurologic Examination:                                                                                                      Blood pressure 169/93, pulse 85, temperature 98.1 F (36.7 C), temperature source Oral, resp. rate 18, SpO2 97 %.  HEENT-  Normocephalic, no lesions, without obvious abnormality.  Normal external eye and conjunctiva.  Normal TM's bilaterally.  Normal auditory canals and external ears. Normal external nose, mucus membranes and septum.  Normal pharynx. Cardiovascular- S1, S2 normal, pulses palpable throughout   Lungs- chest clear, no wheezing, rales, normal symmetric air entry Abdomen- normal findings: bowel sounds normal Extremities- no edema Lymph-no adenopathy palpable Musculoskeletal-no joint tenderness, deformity or swelling Skin-warm and dry, no hyperpigmentation, vitiligo, or suspicious lesions  Neurological Examination Mental Status: Alert, oriented, thought content appropriate.  Speech fluent with some difficulty expressing herself at times and other times she speaks fluently without difficulty.  Able to follow 3 step commands without difficulty. Cranial Nerves: II: Visual fields grossly normal, pupils equal, round, reactive to light and accommodation III,IV, VI: ptosis not present, extra-ocular motions intact bilaterally V,VII:  smile symmetric, facial light touch sensation normal bilaterally VIII: hearing normal bilaterally IX,X: uvula rises symmetrically XI: bilateral shoulder shrug XII: midline tongue extension Motor: Right : Upper extremity   5/5    Left:     Upper extremity   5/5  Lower extremity   5/5     Lower extremity   5/5 Tone and bulk:normal tone throughout; no atrophy noted Sensory: Pinprick and light touch intact throughout, bilaterally Deep Tendon Reflexes: 2+ and symmetric throughout upper extremities bilaterally in addition knee jerks. 1+ ankle jerks bilaterally Plantars: Right: downgoing   Left: downgoing Cerebellar: normal finger-to-nose and normal heel-to-shin test Gait: Not tested       Lab Results: Basic Metabolic Panel:  Recent Labs Lab 03/05/16 1331  NA 135  K 3.9  CL 100*  GLUCOSE 222*  BUN 14  CREATININE 0.70    Liver Function Tests:  No results for input(s): AST, ALT, ALKPHOS, BILITOT, PROT, ALBUMIN in the last 168 hours. No results for input(s): LIPASE, AMYLASE in the last 168 hours. No results for input(s): AMMONIA in the last 168 hours.  CBC:  Recent Labs Lab 03/05/16 1308 03/05/16 1331  WBC 9.9  --   NEUTROABS 6.5  --   HGB 15.1* 16.3*  HCT 47.0* 48.0*  MCV 88.7  --   PLT 202  --     Cardiac Enzymes: No results for input(s): CKTOTAL, CKMB, CKMBINDEX, TROPONINI in the last 168 hours.  Lipid Panel: No results for input(s): CHOL, TRIG, HDL, CHOLHDL, VLDL, LDLCALC in the last 168 hours.  CBG: No results for input(s): GLUCAP in the last 168 hours.  Microbiology: Results for orders placed or performed during the hospital encounter of 02/09/15  MRSA PCR Screening     Status: None   Collection Time: 02/09/15  2:24 PM  Result Value Ref Range Status   MRSA by PCR NEGATIVE NEGATIVE Final    Comment:        The GeneXpert MRSA Assay (FDA approved for NASAL specimens only), is one component of a comprehensive MRSA colonization surveillance program. It  is not intended to diagnose MRSA infection nor to guide or monitor treatment for MRSA infections.     Coagulation Studies:  Recent Labs  03/05/16 1308  LABPROT 12.8  INR 0.96    Imaging: No results found.     Assessment and plan discussed with with attending physician and they are in agreement.    Etta Quill PA-C [facility] [phone]  03/05/2016, 1:46 PM   Assessment: 50 y.o. female with intermittent and now persistent symptoms of expressive difficulty with speech and the patient with angioma gram of neck showing 65-70% restenosis of the site of previously angioplasty left middle cerebral artery and supraclinoid left ICA back in 10/27/2015. Suspect greater restenosis previously angioplasty left MCA versus ICA.   Stroke Risk Factors - hyperlipidemia and hypertension   Recommend: 1-CTA head and neck 2-MRI brain without contrast to evaluate for possible CVA 3- HgbA1c, fasting lipid panel 4- PT consult, OT consult, Speech consult 5- Echocardiogram 6- Prophylactic therapy-Antiplatelet med: Aspirin - dose 81 mg and Antiplatelet med: Plavix - dose 75 mg daily 7. Risk factor modification 8. Telemetry monitoring 9. Frequent neuro checks 10 NPO until passes stroke swallow screen 11 please page stroke NP  Or  PA  Or MD from 8am -4 pm  as this patient from this time will be  followed by the stroke.   You can look them up on [email or web address]  Password [email or web address]    Dr. Tasia Catchings to attest note  Neurology attending:  Shanon Brow and I have both evaluated Jill Taylor today. She notes that her speech is actually improved during the course of the day. However she was having more word finding difficulties over the past several days. She has a prior history of angioplasty for severe supraclinoid left ICA stenosis. We will request MRI to rule out any new ischemic change. The case has been discussed with Dr. Leonie Man. We will request angioplasty for tomorrow unless the MRI is negative. She was  not a candidate for TPA today due to the fact that her symptoms have been present for several days.   Elson Clan, M.D. Neuro hospitalist

## 2016-03-05 NOTE — ED Notes (Signed)
Patient went to CT

## 2016-03-05 NOTE — ED Notes (Signed)
SLP at bedside.

## 2016-03-05 NOTE — ED Provider Notes (Signed)
[facility] DEPT Provider Note   CSN: [id number] Arrival date & time: 03/05/16  1243     History   Chief Complaint Chief Complaint  Patient presents with  . Stroke Symptoms    HPI Jill Taylor is a 50 y.o. female.  HPI Patient presents with difficulty speaking. Reportedly has had it since Wednesday, with today being Monday. States she is having trouble getting the words out. Sort of a stuttering speech. States that the words right her head that does not come home. She has had history of previous aneurysms and strokes. States she had difficulty speaking with mom as has some residual left-sided weakness. Slight dull headache. No vision changes. Previous had aneurysm coiled.   Past Medical History:  Diagnosis Date  . Anxiety   . Bipolar disorder ([place])   . Brain aneurysm   . COPD (chronic obstructive pulmonary disease) ([place])   . Depression   . Diabetes mellitus without complication ([place])   . Dysrhythmia   . GERD (gastroesophageal reflux disease)   . Headache   . High cholesterol   . Hypertension   . IBS (irritable bowel syndrome)   . Pneumonia 2013 ish  . PTSD (post-traumatic stress disorder)   . Shortness of breath dyspnea    with exertion  . Stroke [facility]) 05/2014   05/2014-TIA, TIA and CVA- 7 since 05/2014- memory loss and expressive aphasia. Left sided weakness  . UTI (lower urinary tract infection)     Patient Active Problem List   Diagnosis Date Noted  . Stroke ([place]) 03/05/2016  . 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 High cholesterol   . Brain aneurysm   . PTSD (post-traumatic stress disorder)   . Carotid stenosis     Past Surgical History:  Procedure Laterality Date  . ABDOMINAL HYSTERECTOMY    . BRAIN SURGERY  2007  . BRAIN SURGERY    . CHOLECYSTECTOMY    . RADIOLOGY WITH ANESTHESIA N/A 02/09/2015   Procedure: ANGIOPLASTY;  Surgeon: Luanne Bras, MD;  Location: [place];  Service: Radiology;  Laterality: N/A;  . TUBAL LIGATION       OB History    No data available       Home Medications    Prior to Admission medications   Medication Sig Start Date End Date Taking? Authorizing Provider  ACETAMINOPHEN-BUTALBITAL 50-325 MG TABS Take 2 tablets by mouth daily as needed (for headache or migraine).    Yes Historical Provider, MD  albuterol (PROVENTIL HFA;VENTOLIN HFA) 108 (90 BASE) MCG/ACT inhaler Inhale 2 puffs into the lungs every 6 (six) hours as needed for wheezing or shortness of breath.   Yes Historical Provider, MD  aspirin 81 MG tablet Take 81 mg by mouth daily.   Yes Historical Provider, MD  atorvastatin (LIPITOR) 40 MG tablet Take 40 mg by mouth at bedtime. 01/11/15  Yes Historical Provider, MD  Black Cohosh 40 MG CAPS Take 40 mg by mouth 2 (two) times daily.   Yes Historical Provider, MD  busPIRone (BUSPAR) 10 MG tablet Take 10 mg by mouth 2 (two) times daily.    Yes Historical Provider, MD  canagliflozin (INVOKANA) 300 MG TABS tablet Take 300 mg by mouth daily.   Yes Historical Provider, MD  cetirizine (ZYRTEC) 10 MG tablet Take 10 mg by mouth daily. 01/11/15  Yes Historical Provider, MD  clonazePAM (KLONOPIN) 0.5 MG tablet Take 0.5 mg by mouth 2 (two) times daily.    Yes Historical Provider, MD  clopidogrel (PLAVIX) 75 MG tablet Take  75 mg by mouth daily.  11/08/14  Yes Historical Provider, MD  Dulaglutide (TRULICITY) 1.5 0000000 SOPN Inject 1.5 mg into the skin once a week.   Yes Historical Provider, MD  Fluticasone Furoate-Vilanterol (BREO ELLIPTA) 100-25 MCG/INH AEPB Inhale 2 puffs into the lungs daily.    Yes Historical Provider, MD  glipiZIDE (GLUCOTROL XL) 10 MG 24 hr tablet Take 10 mg by mouth 2 (two) times daily.   Yes Historical Provider, MD  ipratropium-albuterol (DUONEB) 0.5-2.5 (3) MG/3ML SOLN Take 3 mLs by nebulization every 6 (six) hours as needed (for shortness of breath).   Yes Historical Provider, MD  lisinopril (PRINIVIL,ZESTRIL) 5 MG tablet Take 2.5 mg by mouth daily.  01/11/15  Yes Historical  Provider, MD  loperamide (IMODIUM) 2 MG capsule Take 2 mg by mouth as needed for diarrhea or loose stools.   Yes Historical Provider, MD  Magnesium 250 MG TABS Take 250 mg by mouth 3 (three) times daily.   Yes Historical Provider, MD  Melatonin 10 MG TABS Take 10 mg by mouth at bedtime.   Yes Historical Provider, MD  nortriptyline (PAMELOR) 25 MG capsule Take 25 mg by mouth 2 (two) times daily.   Yes Historical Provider, MD  omeprazole (PRILOSEC) 40 MG capsule Take 40 mg by mouth 2 (two) times daily. 01/11/15  Yes Historical Provider, MD  polyethylene glycol (MIRALAX / GLYCOLAX) packet Take 17 g by mouth daily as needed for moderate constipation.   Yes Historical Provider, MD  pregabalin (LYRICA) 200 MG capsule Take 200 mg by mouth 3 (three) times daily.   Yes Historical Provider, MD  QUEtiapine (SEROQUEL) 25 MG tablet Take 25 mg by mouth 2 (two) times daily.    Yes Historical Provider, MD  traMADol (ULTRAM) 50 MG tablet Take 50 mg by mouth 2 (two) times daily as needed for moderate pain.   Yes Historical Provider, MD  clotrimazole (LOTRIMIN) 1 % cream Apply 1 application topically at bedtime as needed (athlete's foot).    Historical Provider, MD    Family History History reviewed. No pertinent family history.  Social History Social History  Substance Use Topics  . Smoking status: Current Every Day Smoker    Types: Cigarettes  . Smokeless tobacco: Never Used  . Alcohol use No     Allergies   Other and Sulfa antibiotics   Review of Systems Review of Systems  Constitutional: Negative for appetite change.  HENT: Negative for trouble swallowing and voice change.   Eyes: Negative for photophobia and redness.  Respiratory: Negative for chest tightness and shortness of breath.   Cardiovascular: Negative for chest pain.  Genitourinary: Negative for dysuria.  Musculoskeletal: Negative for back pain and gait problem.  Neurological: Positive for speech difficulty and headaches. Negative  for weakness.  Hematological: Negative for adenopathy.  Psychiatric/Behavioral: Negative for behavioral problems.     Physical Exam Updated Vital Signs BP 130/82   Pulse 72   Temp 98.1 F (36.7 C) (Oral)   Resp 19   SpO2 96%   Physical Exam  Constitutional: She is oriented to person, place, and time. She appears well-developed and well-nourished.  HENT:  Head: Atraumatic.  Eyes: EOM are normal.  Neck: Neck supple.  Cardiovascular: Normal rate.   Pulmonary/Chest: Effort normal.  Abdominal: Soft.  Musculoskeletal: She exhibits no edema.  Neurological: She is alert and oriented to person, place, and time. No cranial nerve deficit.  Extraocular movements intact. Visual fields intact grossly. Face symmetric. Good grip strength bilaterally. Somewhat stuttering  speech.  Skin: Capillary refill takes less than 2 seconds.  Psychiatric: She has a normal mood and affect.     ED Treatments / Results  Labs (all labs ordered are listed, but only abnormal results are displayed) Labs Reviewed  CBC - Abnormal; Notable for the following:       Result Value   RBC 5.30 (*)    Hemoglobin 15.1 (*)    HCT 47.0 (*)    All other components within normal limits  COMPREHENSIVE METABOLIC PANEL - Abnormal; Notable for the following:    Sodium 133 (*)    Glucose, Bld 219 (*)    Alkaline Phosphatase 145 (*)    All other components within normal limits  CBG MONITORING, ED - Abnormal; Notable for the following:    Glucose-Capillary 202 (*)    All other components within normal limits  I-STAT CHEM 8, ED - Abnormal; Notable for the following:    Chloride 100 (*)    Glucose, Bld 222 (*)    Calcium, Ion 1.04 (*)    Hemoglobin 16.3 (*)    HCT 48.0 (*)    All other components within normal limits  PROTIME-INR  APTT  DIFFERENTIAL  I-STAT TROPOININ, ED    EKG  EKG Interpretation  Date/Time:  Monday March 05 2016 13:00:09 EDT Ventricular Rate:  88 PR Interval:  146 QRS Duration: 80 QT  Interval:  354 QTC Calculation: 428 R Axis:   37 Text Interpretation:  Normal sinus rhythm Normal ECG Confirmed by Alvino Chapel  MD, Ovid Curd [phone]) on 03/05/2016 2:23:39 PM       Radiology No results found.  Procedures Procedures (including critical care time)  Medications Ordered in ED Medications  iopamidol (ISOVUE-370) 76 % injection (50 mLs  Contrast Given 03/05/16 1452)     Initial Impression / Assessment and Plan / ED Course  I have reviewed the triage vital signs and the nursing notes.  Pertinent labs & imaging results that were available during my care of the patient were reviewed by me and considered in my medical decision making (see chart for details).  Clinical Course    Patient with difficulty speaking and known vascular abnormalities. Likely a stroke versus symptomatic narrowing of the vessels. Seen by neurology. Not a TPA candidate due to time of onset. Will admit to family medicine.    Final Clinical Impressions(s) / ED Diagnoses   Final diagnoses:  Difficulty speaking  Stroke-like symptoms    New Prescriptions New Prescriptions   No medications on file     Davonna Belling, MD 03/05/16 1539

## 2016-03-05 NOTE — ED Notes (Signed)
Called Speech Therapy to Evaluate Swallowing. Requested for today

## 2016-03-05 NOTE — ED Notes (Signed)
Neurology PA at the bedside. 

## 2016-03-05 NOTE — ED Notes (Addendum)
Patient passed Stroke Swallow Screen with Speech Therapist after failing with this RN due to Hx of Dysphagia. Pt is allowed regular diet. Pt's last Neuro Check was at 1530. Needs 2qh Neuro - Next at 1730. Pt has an NIH of 0. Reports delayed speech when answering questions that has been worse than normal.

## 2016-03-06 ENCOUNTER — Inpatient Hospital Stay (HOSPITAL_COMMUNITY): Payer: Medicare Other

## 2016-03-06 ENCOUNTER — Other Ambulatory Visit (HOSPITAL_COMMUNITY): Payer: Self-pay

## 2016-03-06 DIAGNOSIS — R4781 Slurred speech: Secondary | ICD-10-CM

## 2016-03-06 DIAGNOSIS — F17200 Nicotine dependence, unspecified, uncomplicated: Secondary | ICD-10-CM | POA: Diagnosis present

## 2016-03-06 DIAGNOSIS — E7800 Pure hypercholesterolemia, unspecified: Secondary | ICD-10-CM

## 2016-03-06 DIAGNOSIS — I6522 Occlusion and stenosis of left carotid artery: Secondary | ICD-10-CM

## 2016-03-06 DIAGNOSIS — G9389 Other specified disorders of brain: Secondary | ICD-10-CM

## 2016-03-06 DIAGNOSIS — R4702 Dysphasia: Secondary | ICD-10-CM | POA: Diagnosis present

## 2016-03-06 DIAGNOSIS — R479 Unspecified speech disturbances: Secondary | ICD-10-CM

## 2016-03-06 DIAGNOSIS — R490 Dysphonia: Secondary | ICD-10-CM

## 2016-03-06 DIAGNOSIS — I739 Peripheral vascular disease, unspecified: Secondary | ICD-10-CM

## 2016-03-06 DIAGNOSIS — R2990 Unspecified symptoms and signs involving the nervous system: Secondary | ICD-10-CM

## 2016-03-06 DIAGNOSIS — I6930 Unspecified sequelae of cerebral infarction: Secondary | ICD-10-CM

## 2016-03-06 DIAGNOSIS — G459 Transient cerebral ischemic attack, unspecified: Secondary | ICD-10-CM

## 2016-03-06 LAB — GLUCOSE, CAPILLARY
GLUCOSE-CAPILLARY: 183 mg/dL — AB (ref 65–99)
Glucose-Capillary: 171 mg/dL — ABNORMAL HIGH (ref 65–99)
Glucose-Capillary: 168 mg/dL — ABNORMAL HIGH (ref 65–99)
Glucose-Capillary: 149 mg/dL — ABNORMAL HIGH (ref 65–99)
Glucose-Capillary: 317 mg/dL — ABNORMAL HIGH (ref 65–99)

## 2016-03-06 LAB — BASIC METABOLIC PANEL
ANION GAP: 10 (ref 5–15)
BUN: 12 mg/dL (ref 6–20)
CO2: 24 mmol/L (ref 22–32)
Calcium: 9.4 mg/dL (ref 8.9–10.3)
Chloride: 104 mmol/L (ref 101–111)
Creatinine, Ser: 0.88 mg/dL (ref 0.44–1.00)
GLUCOSE: 165 mg/dL — AB (ref 65–99)
POTASSIUM: 4.4 mmol/L (ref 3.5–5.1)
SODIUM: 138 mmol/L (ref 135–145)

## 2016-03-06 LAB — LIPID PANEL
CHOL/HDL RATIO: 3.5 ratio
CHOLESTEROL: 149 mg/dL (ref 0–200)
HDL: 42 mg/dL (ref >40)
LDL Cholesterol: 92 mg/dL (ref 0–99)
TRIGLYCERIDES: 74 mg/dL (ref <150)
VLDL: 15 mg/dL (ref 0–40)

## 2016-03-06 LAB — CBC
HEMATOCRIT: 45.6 % (ref 36.0–46.0)
HEMOGLOBIN: 14.9 g/dL (ref 12.0–15.0)
MCH: 29.2 pg (ref 26.0–34.0)
MCHC: 32.7 g/dL (ref 30.0–36.0)
MCV: 89.2 fL (ref 78.0–100.0)
PLATELETS: 170 10*3/uL (ref 150–400)
RBC: 5.11 MIL/uL (ref 3.87–5.11)
RDW: 15.4 % (ref 11.5–15.5)
WBC: 8.7 10*3/uL (ref 4.0–10.5)

## 2016-03-06 LAB — ECHOCARDIOGRAM COMPLETE

## 2016-03-06 MED ORDER — GI COCKTAIL ~~LOC~~
30.0000 mL | Freq: Three times a day (TID) | ORAL | Status: DC | PRN
  Filled 2016-03-06: qty 30

## 2016-03-06 MED ORDER — INSULIN ASPART 100 UNIT/ML ~~LOC~~ SOLN
0.0000 [IU] | Freq: Three times a day (TID) | SUBCUTANEOUS | Status: DC
  Administered 2016-03-07: 5 [IU] via SUBCUTANEOUS
  Administered 2016-03-07: 8 [IU] via SUBCUTANEOUS
  Administered 2016-03-07: 3 [IU] via SUBCUTANEOUS

## 2016-03-06 MED ORDER — INSULIN ASPART 100 UNIT/ML ~~LOC~~ SOLN
0.0000 [IU] | SUBCUTANEOUS | Status: DC
  Administered 2016-03-06: 7 [IU] via SUBCUTANEOUS

## 2016-03-06 MED ORDER — INSULIN ASPART 100 UNIT/ML ~~LOC~~ SOLN
0.0000 [IU] | Freq: Every day | SUBCUTANEOUS | Status: DC
Start: 2016-03-06 — End: 2016-03-07

## 2016-03-06 MED ORDER — ATORVASTATIN CALCIUM 80 MG PO TABS
80.0000 mg | ORAL_TABLET | Freq: Every day | ORAL | Status: DC
  Administered 2016-03-07: 80 mg via ORAL
  Filled 2016-03-06: qty 1

## 2016-03-06 MED ORDER — ENOXAPARIN SODIUM 40 MG/0.4ML ~~LOC~~ SOLN: 40.0000 mg | SUBCUTANEOUS | Status: DC

## 2016-03-06 NOTE — Consult Note (Signed)
Chief Complaint: Patient was seen in consultation today for cerebral arteriogram Chief Complaint  Patient presents with  . Stroke Symptoms   at the request of Dr Elson Clan  Referring Physician(s): Dr Elson Clan  Supervising Physician: Luanne Bras  Patient Status: Inpatient  History of Present Illness: Jill Taylor is a 50 y.o. female   Intermittent expressive aphasia Known to NIR L middle cerebral artery and L internal carotid artery angioplasty 02/09/2015 Symptoms resolved nicely   Recheck arteriogram 05/2015: IMPRESSION: Approximately 65-70% restenosis at the site of the previously angioplastied left middle cerebral artery M1 segment, and the supraclinoid left ICA.  CTA 10/27/2015: IMPRESSION: 1. Moderate to severe stenosis at the left ICA terminus, the left MCA origin and in the proximal M1 segment. 2. 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 No aneurysm filling or adverse features identified by CTA.  Expressive aphasia recurrent and worsening as of 1 week ago  CTA 03/05/16: IMPRESSION: 1. Negative for emergent large vessel occlusion. 2. Stable CTA head and neck findings since April remarkable for - high-grade stenosis Left ICA terminus, Left MCA and ACA origins. - diminutive but patent Left MCA M1 segment. Left ACA A2 and distal branches satisfactorily supplied by the anterior communicating artery. - stable CT appearance of Right MCA bifurcation aneurysm clipping. - mild-to-moderate proximal right subclavian and left greater than right vertebral artery origin stenoses. - age advanced soft and calcified atherosclerotic plaque in the head and neck.  3. Stable CT appearance of the brain. No acute intracranial abnormality identified.   Request for cerebral arteriogram for full evaluation Scheduled now for same  Past Medical History:  Diagnosis Date  . Anxiety   . Bipolar disorder  ([place])   . Brain aneurysm   . COPD (chronic obstructive pulmonary disease) ([place])   . Depression   . Diabetes mellitus without complication ([place])   . Dysrhythmia   . GERD (gastroesophageal reflux disease)   . Headache   . High cholesterol   . Hypertension   . IBS (irritable bowel syndrome)   . Pneumonia 2013 ish  . PTSD (post-traumatic stress disorder)   . Shortness of breath dyspnea    with exertion  . Stroke [facility]) 05/2014   05/2014-TIA, TIA and CVA- 7 since 05/2014- memory loss and expressive aphasia. Left sided weakness  . UTI (lower urinary tract infection)     Past Surgical History:  Procedure Laterality Date  . ABDOMINAL HYSTERECTOMY    . BRAIN SURGERY  2007  . BRAIN SURGERY    . CHOLECYSTECTOMY    . RADIOLOGY WITH ANESTHESIA N/A 02/09/2015   Procedure: ANGIOPLASTY;  Surgeon: Luanne Bras, MD;  Location: [place];  Service: Radiology;  Laterality: N/A;  . TUBAL LIGATION      Allergies: Other and Sulfa antibiotics  Medications: Prior to Admission medications   Medication Sig Start Date End Date Taking? Authorizing Provider  ACETAMINOPHEN-BUTALBITAL 50-325 MG TABS Take 2 tablets by mouth daily as needed (for headache or migraine).    Yes Historical Provider, MD  albuterol (PROVENTIL HFA;VENTOLIN HFA) 108 (90 BASE) MCG/ACT inhaler Inhale 2 puffs into the lungs every 6 (six) hours as needed for wheezing or shortness of breath.   Yes Historical Provider, MD  aspirin 81 MG tablet Take 81 mg by mouth daily.   Yes Historical Provider, MD  atorvastatin (LIPITOR) 40 MG tablet Take 40 mg by mouth at bedtime. 01/11/15  Yes Historical Provider, MD  Black Cohosh 40 MG CAPS  Take 40 mg by mouth 2 (two) times daily.   Yes Historical Provider, MD  busPIRone (BUSPAR) 10 MG tablet Take 10 mg by mouth 2 (two) times daily.    Yes Historical Provider, MD  canagliflozin (INVOKANA) 300 MG TABS tablet Take 300 mg by mouth daily.   Yes Historical Provider, MD  cetirizine (ZYRTEC) 10 MG tablet  Take 10 mg by mouth daily. 01/11/15  Yes Historical Provider, MD  clonazePAM (KLONOPIN) 0.5 MG tablet Take 0.5 mg by mouth 2 (two) times daily.    Yes Historical Provider, MD  clopidogrel (PLAVIX) 75 MG tablet Take 75 mg by mouth daily.  11/08/14  Yes Historical Provider, MD  Dulaglutide (TRULICITY) 1.5 0000000 SOPN Inject 1.5 mg into the skin once a week.   Yes Historical Provider, MD  Fluticasone Furoate-Vilanterol (BREO ELLIPTA) 100-25 MCG/INH AEPB Inhale 2 puffs into the lungs daily.    Yes Historical Provider, MD  glipiZIDE (GLUCOTROL XL) 10 MG 24 hr tablet Take 10 mg by mouth 2 (two) times daily.   Yes Historical Provider, MD  ipratropium-albuterol (DUONEB) 0.5-2.5 (3) MG/3ML SOLN Take 3 mLs by nebulization every 6 (six) hours as needed (for shortness of breath).   Yes Historical Provider, MD  lisinopril (PRINIVIL,ZESTRIL) 5 MG tablet Take 2.5 mg by mouth daily.  01/11/15  Yes Historical Provider, MD  loperamide (IMODIUM) 2 MG capsule Take 2 mg by mouth as needed for diarrhea or loose stools.   Yes Historical Provider, MD  Magnesium 250 MG TABS Take 250 mg by mouth 3 (three) times daily.   Yes Historical Provider, MD  Melatonin 10 MG TABS Take 10 mg by mouth at bedtime.   Yes Historical Provider, MD  nortriptyline (PAMELOR) 25 MG capsule Take 25 mg by mouth 2 (two) times daily.   Yes Historical Provider, MD  omeprazole (PRILOSEC) 40 MG capsule Take 40 mg by mouth 2 (two) times daily. 01/11/15  Yes Historical Provider, MD  polyethylene glycol (MIRALAX / GLYCOLAX) packet Take 17 g by mouth daily as needed for moderate constipation.   Yes Historical Provider, MD  pregabalin (LYRICA) 200 MG capsule Take 200 mg by mouth 3 (three) times daily.   Yes Historical Provider, MD  QUEtiapine (SEROQUEL) 25 MG tablet Take 25 mg by mouth 2 (two) times daily.    Yes Historical Provider, MD  traMADol (ULTRAM) 50 MG tablet Take 50 mg by mouth 2 (two) times daily as needed for moderate pain.   Yes Historical  Provider, MD  clotrimazole (LOTRIMIN) 1 % cream Apply 1 application topically at bedtime as needed (athlete's foot).    Historical Provider, MD     History reviewed. No pertinent family history.  Social History   Social History  . Marital status: Married    Spouse name: N/A  . Number of children: N/A  . Years of education: N/A   Social History Main Topics  . Smoking status: Current Every Day Smoker    Types: Cigarettes  . Smokeless tobacco: Never Used  . Alcohol use No  . Drug use: No  . Sexual activity: Not Asked   Other Topics Concern  . None   Social History Narrative  . None     Review of Systems: A 12 point ROS discussed and pertinent positives are indicated in the HPI above.  All other systems are negative.  Review of Systems  Constitutional: Negative for activity change, appetite change, fatigue, fever and unexpected weight change.  HENT: Negative for hearing loss, tinnitus, trouble  swallowing and voice change.   Eyes: Negative for visual disturbance.  Respiratory: Negative for shortness of breath.   Cardiovascular: Negative for chest pain.  Gastrointestinal: Negative for abdominal pain.  Musculoskeletal: Negative for back pain.  Neurological: Positive for speech difficulty. Negative for dizziness, tremors, seizures, syncope, facial asymmetry, weakness, light-headedness, numbness and headaches.  Psychiatric/Behavioral: Negative for behavioral problems and confusion.    Vital Signs: BP 137/78 (BP Location: Left Arm)   Pulse 80   Temp 98 F (36.7 C) (Oral)   Resp 18   SpO2 98%   Physical Exam  Constitutional: She is oriented to person, place, and time. She appears well-nourished.  HENT:  Head: Atraumatic.  Eyes: EOM are normal.  Neck: Neck supple.  Cardiovascular: Normal rate, regular rhythm and normal heart sounds.   Pulmonary/Chest: Effort normal and breath sounds normal.  Abdominal: Soft. Bowel sounds are normal.  Musculoskeletal: Normal range of  motion. She exhibits edema.  Neurological: She is alert and oriented to person, place, and time.  Definite noted expressive aphasia Pt is able to communicate well despite  Skin: Skin is warm and dry.  Psychiatric: She has a normal mood and affect. Her behavior is normal. Judgment and thought content normal.  Nursing note and vitals reviewed.   Mallampati Score:  MD Evaluation Airway: WNL Heart: WNL Abdomen: WNL Chest/ Lungs: WNL ASA  Classification: 3 Mallampati/Airway Score: One  Imaging: Ct Angio Head W Or Wo Contrast  Result Date: 03/05/2016 CLINICAL DATA:  50 year old female with expressive aphasia. Initial encounter. history of left middle cerebral artery symptomatic stenosis status post percutaneous. Previous right MCA bifurcation aneurysm clipping. intracranial angioplasty. Moderate to severe left ICA terminus and proximal left MCA stenosis by CTA in April this year. EXAM: CT ANGIOGRAPHY HEAD AND NECK TECHNIQUE: Multidetector CT imaging of the head and neck was performed using the standard protocol during bolus administration of intravenous contrast. Multiplanar CT image reconstructions and MIPs were obtained to evaluate the vascular anatomy. Carotid stenosis measurements (when applicable) are obtained utilizing NASCET criteria, using the distal internal carotid diameter as the denominator. CONTRAST:  50 mL Isovue 370. COMPARISON:  CTA head and neck 10/27/2015, and earlier. FINDINGS: CT HEAD Brain: Right middle cranial fossa aneurysm clips appear stable in configuration. Mild streak artifact. Stable mild left frontal lobe cortical encephalomalacia. Occasional scattered left greater than right white matter hypodensity appears stable. No acute intracranial hemorrhage identified. No midline shift, mass effect, or evidence of intracranial mass lesion. No cortically based acute infarct identified. Calvarium and skull base: Stable sequelae of right frontotemporal craniotomy. Stable visualized  osseous structures. Paranasal sinuses: Visualized paranasal sinuses and mastoids are stable and well pneumatized. Orbits: No acute orbit or scalp soft tissue findings. CTA NECK Skeleton:  No acute osseous abnormality identified. Other neck: Negative lung apices. No superior mediastinal lymphadenopathy. Stable thyroid, larynx, pharynx, parapharyngeal spaces, retropharyngeal space, sublingual space, submandibular glands, and parotid glands. No cervical lymphadenopathy. Aortic arch: Stable soft greater than calcified arch and great vessel origin atherosclerosis. Right carotid system: Stable anterior right CCA soft plaque. Right carotid bifurcation remains patent with soft plaque in the right ICA bulb but no cervical right ICA stenosis. Left carotid system: Stable left CCA mild soft plaque in patency. Soft and calcified plaque is stable up the left ICA origin and bulb without hemodynamically significant stenosis. Vertebral arteries: Stable stenosis of the proximal right subclavian artery. Stable mild right vertebral artery origin stenosis. Negative right vertebral artery otherwise to the skullbase. Stable proximal left subclavian  soft and calcified plaque without hemodynamically significant stenosis. Stable moderate left vertebral artery origin stenosis. The left vertebral artery otherwise is negative to the skullbase. CTA HEAD Posterior circulation: Stable distal vertebral arteries without stenosis. Both PICA origins remain patent. Patent vertebrobasilar junction. Patent basilar artery without stenosis. Stable SCA and PCA origins. Posterior communicating arteries are diminutive or absent. Bilateral PCA branches are stable. Anterior circulation: Both ICA siphons remain patent, but the left is diminutive as before. There is cavernous segment calcified plaque on the right which is stable with up to moderate stenosis. The right ICA terminus appears stable and normal. The left ICA terminus is highly stenotic and diminutive  as before. The left MCA and ACA origins remain highly stenotic but patent, the left A1 segment and proximal left M1 are thread-like as before. More distally appears stable since April. The left MCA Left M1 patency bifurcation remains patent. Left MCA branches are stable. More normal appearing right MCA M1 and ACA A1 segments. Anterior communicating artery remains normal. Bilateral ACA branches are stable and normal. Aneurysm clips at the right MCA trifurcation again noted with subsequent streak artifact. Visible right MCA branches are stable and within normal limits. Venous sinuses: Patent. Anatomic variants: None. Delayed phase: No abnormal enhancement identified. IMPRESSION: 1. Negative for emergent large vessel occlusion. 2. Stable CTA head and neck findings since April remarkable for - high-grade stenosis Left ICA terminus, Left MCA and ACA origins. - diminutive but patent Left MCA M1 segment. Left ACA A2 and distal branches satisfactorily supplied by the anterior communicating artery. - stable CT appearance of Right MCA bifurcation aneurysm clipping. - mild-to-moderate proximal right subclavian and left greater than right vertebral artery origin stenoses. - age advanced soft and calcified atherosclerotic plaque in the head and neck. 3. Stable CT appearance of the brain. No acute intracranial abnormality identified. Electronically Signed   By: Genevie Ann M.D.   On: 03/05/2016 15:41   Ct Angio Neck W Or Wo Contrast  Result Date: 03/05/2016 CLINICAL DATA:  50 year old female with expressive aphasia. Initial encounter. history of left middle cerebral artery symptomatic stenosis status post percutaneous. Previous right MCA bifurcation aneurysm clipping. intracranial angioplasty. Moderate to severe left ICA terminus and proximal left MCA stenosis by CTA in April this year. EXAM: CT ANGIOGRAPHY HEAD AND NECK TECHNIQUE: Multidetector CT imaging of the head and neck was performed using the standard protocol during  bolus administration of intravenous contrast. Multiplanar CT image reconstructions and MIPs were obtained to evaluate the vascular anatomy. Carotid stenosis measurements (when applicable) are obtained utilizing NASCET criteria, using the distal internal carotid diameter as the denominator. CONTRAST:  50 mL Isovue 370. COMPARISON:  CTA head and neck 10/27/2015, and earlier. FINDINGS: CT HEAD Brain: Right middle cranial fossa aneurysm clips appear stable in configuration. Mild streak artifact. Stable mild left frontal lobe cortical encephalomalacia. Occasional scattered left greater than right white matter hypodensity appears stable. No acute intracranial hemorrhage identified. No midline shift, mass effect, or evidence of intracranial mass lesion. No cortically based acute infarct identified. Calvarium and skull base: Stable sequelae of right frontotemporal craniotomy. Stable visualized osseous structures. Paranasal sinuses: Visualized paranasal sinuses and mastoids are stable and well pneumatized. Orbits: No acute orbit or scalp soft tissue findings. CTA NECK Skeleton:  No acute osseous abnormality identified. Other neck: Negative lung apices. No superior mediastinal lymphadenopathy. Stable thyroid, larynx, pharynx, parapharyngeal spaces, retropharyngeal space, sublingual space, submandibular glands, and parotid glands. No cervical lymphadenopathy. Aortic arch: Stable soft greater than calcified arch  and great vessel origin atherosclerosis. Right carotid system: Stable anterior right CCA soft plaque. Right carotid bifurcation remains patent with soft plaque in the right ICA bulb but no cervical right ICA stenosis. Left carotid system: Stable left CCA mild soft plaque in patency. Soft and calcified plaque is stable up the left ICA origin and bulb without hemodynamically significant stenosis. Vertebral arteries: Stable stenosis of the proximal right subclavian artery. Stable mild right vertebral artery origin  stenosis. Negative right vertebral artery otherwise to the skullbase. Stable proximal left subclavian soft and calcified plaque without hemodynamically significant stenosis. Stable moderate left vertebral artery origin stenosis. The left vertebral artery otherwise is negative to the skullbase. CTA HEAD Posterior circulation: Stable distal vertebral arteries without stenosis. Both PICA origins remain patent. Patent vertebrobasilar junction. Patent basilar artery without stenosis. Stable SCA and PCA origins. Posterior communicating arteries are diminutive or absent. Bilateral PCA branches are stable. Anterior circulation: Both ICA siphons remain patent, but the left is diminutive as before. There is cavernous segment calcified plaque on the right which is stable with up to moderate stenosis. The right ICA terminus appears stable and normal. The left ICA terminus is highly stenotic and diminutive as before. The left MCA and ACA origins remain highly stenotic but patent, the left A1 segment and proximal left M1 are thread-like as before. More distally appears stable since April. The left MCA Left M1 patency bifurcation remains patent. Left MCA branches are stable. More normal appearing right MCA M1 and ACA A1 segments. Anterior communicating artery remains normal. Bilateral ACA branches are stable and normal. Aneurysm clips at the right MCA trifurcation again noted with subsequent streak artifact. Visible right MCA branches are stable and within normal limits. Venous sinuses: Patent. Anatomic variants: None. Delayed phase: No abnormal enhancement identified. IMPRESSION: 1. Negative for emergent large vessel occlusion. 2. Stable CTA head and neck findings since April remarkable for - high-grade stenosis Left ICA terminus, Left MCA and ACA origins. - diminutive but patent Left MCA M1 segment. Left ACA A2 and distal branches satisfactorily supplied by the anterior communicating artery. - stable CT appearance of Right MCA  bifurcation aneurysm clipping. - mild-to-moderate proximal right subclavian and left greater than right vertebral artery origin stenoses. - age advanced soft and calcified atherosclerotic plaque in the head and neck. 3. Stable CT appearance of the brain. No acute intracranial abnormality identified. Electronically Signed   By: Genevie Ann M.D.   On: 03/05/2016 15:41    Labs:  CBC:  Recent Labs  06/02/15 0733 03/05/16 1308 03/05/16 1331 03/06/16 0747  WBC 8.0 9.9  --  8.7  HGB 14.6 15.1* 16.3* 14.9  HCT 44.2 47.0* 48.0* 45.6  PLT 180 202  --  170    COAGS:  Recent Labs  06/02/15 0733 03/05/16 1308  INR 0.95 0.96  APTT 24 24    BMP:  Recent Labs  06/02/15 0733 03/05/16 1308 03/05/16 1331 03/06/16 0747  NA 136 133* 135 138  K 4.2 4.0 3.9 4.4  CL 103 102 100* 104  CO2 25 22  --  24  GLUCOSE 150* 219* 222* 165*  BUN 9 12 14 12   CALCIUM 9.0 9.2  --  9.4  CREATININE 0.78 0.88 0.70 0.88  GFRNONAA >60 >60  --  >60  GFRAA >60 >60  --  >60    LIVER FUNCTION TESTS:  Recent Labs  03/05/16 1308  BILITOT 0.9  AST 20  ALT 22  ALKPHOS 145*  PROT 7.0  ALBUMIN  4.0    TUMOR MARKERS: No results for input(s): AFPTM, CEA, CA199, CHROMGRNA in the last 8760 hours.  Assessment and Plan:  Intermittent expressive aphasia Worsening and constant over last week CTA 8/22 does reveal LMCA and ICA stenosis Scheduled for cerebral arteriogram today Risks and Benefits discussed with the patient including, but not limited to bleeding, infection, vascular injury, contrast induced renal failure, stroke or even death. All of the patient's questions were answered, patient is agreeable to proceed. Consent signed and in chart.  Thank you for this interesting consult.  I greatly enjoyed meeting Jill Taylor and look forward to participating in their care.  A copy of this report was sent to the requesting provider on this date.  Electronically Signed: Monia Sabal A 03/06/2016, 9:36  AM   I spent a total of 40 Minutes    in face to face in clinical consultation, greater than 50% of which was counseling/coordinating care for cerebral arteriogram

## 2016-03-06 NOTE — Progress Notes (Signed)
STROKE TEAM PROGRESS NOTE   HISTORY OF PRESENT ILLNESS (per record) Jill Taylor is an 50 y.o. female who was seen last year on 02/09/2015 in which she had a left ICA supraclinoid clinoid balloon angioplasty. Patient and patient's daughter state after the angioplasty her speech seemed to improve. At times she would have intermittent expressive aphasia. And awoke this past Wednesday and noted that her expressive aphasia was worse. As what she wants to say but at times has hard time getting her words out. Over the last few days it has progressively worsened and now is consistent without intermittent periods. Currently her only complaint is at times difficulty saying the word that she would like to say. She denies any slurred speech, headache, numbness, tingling, weakness. She was last known well 02/29/2016, time unknown. Patient was not administered IV t-PA secondary to unknown time LKW. She was admitted for further evaluation and treatment.   SUBJECTIVE (INTERVAL HISTORY) No family is at the bedside.  She is sitting up in the bed. she is recounting HPI with Dr. Leonie Man. She is still having some expressive aphasia.    OBJECTIVE Temp:  [97.5 F (36.4 C)-98.4 F (36.9 C)] 98 F (36.7 C) (08/22 0935) Pulse Rate:  [65-101] 80 (08/22 0935) Cardiac Rhythm: Normal sinus rhythm (08/22 0813) Resp:  [16-22] 18 (08/22 0935) BP: (108-169)/(60-98) 137/78 (08/22 0935) SpO2:  [93 %-100 %] 98 % (08/22 0935)  CBC:   Recent Labs Lab 03/05/16 1308 03/05/16 1331 03/06/16 0747  WBC 9.9  --  8.7  NEUTROABS 6.5  --   --   HGB 15.1* 16.3* 14.9  HCT 47.0* 48.0* 45.6  MCV 88.7  --  89.2  PLT 202  --  [id number]    Basic Metabolic Panel:   Recent Labs Lab 03/05/16 1308 03/05/16 1331 03/06/16 0747  NA 133* 135 138  K 4.0 3.9 4.4  CL 102 100* 104  CO2 22  --  24  GLUCOSE 219* 222* 165*  BUN 12 14 12   CREATININE 0.88 0.70 0.88  CALCIUM 9.2  --  9.4    Lipid Panel:     Component Value Date/Time    CHOL 149 03/06/2016 0636   TRIG 74 03/06/2016 0636   HDL 42 03/06/2016 0636   CHOLHDL 3.5 03/06/2016 0636   VLDL 15 03/06/2016 0636   LDLCALC 92 03/06/2016 0636   HgbA1c: No results found for: HGBA1C Urine Drug Screen: No results found for: LABOPIA, COCAINSCRNUR, LABBENZ, AMPHETMU, THCU, LABBARB    IMAGING  Ct Angio Head W Or Wo Contrast  Result Date: 03/05/2016 CLINICAL DATA:  50 year old female with expressive aphasia. Initial encounter. history of left middle cerebral artery symptomatic stenosis status post percutaneous. Previous right MCA bifurcation aneurysm clipping. intracranial angioplasty. Moderate to severe left ICA terminus and proximal left MCA stenosis by CTA in April this year. EXAM: CT ANGIOGRAPHY HEAD AND NECK TECHNIQUE: Multidetector CT imaging of the head and neck was performed using the standard protocol during bolus administration of intravenous contrast. Multiplanar CT image reconstructions and MIPs were obtained to evaluate the vascular anatomy. Carotid stenosis measurements (when applicable) are obtained utilizing NASCET criteria, using the distal internal carotid diameter as the denominator. CONTRAST:  50 mL Isovue 370. COMPARISON:  CTA head and neck 10/27/2015, and earlier. FINDINGS: CT HEAD Brain: Right middle cranial fossa aneurysm clips appear stable in configuration. Mild streak artifact. Stable mild left frontal lobe cortical encephalomalacia. Occasional scattered left greater than right white matter hypodensity appears stable. No acute intracranial  hemorrhage identified. No midline shift, mass effect, or evidence of intracranial mass lesion. No cortically based acute infarct identified. Calvarium and skull base: Stable sequelae of right frontotemporal craniotomy. Stable visualized osseous structures. Paranasal sinuses: Visualized paranasal sinuses and mastoids are stable and well pneumatized. Orbits: No acute orbit or scalp soft tissue findings. CTA NECK Skeleton:  No  acute osseous abnormality identified. Other neck: Negative lung apices. No superior mediastinal lymphadenopathy. Stable thyroid, larynx, pharynx, parapharyngeal spaces, retropharyngeal space, sublingual space, submandibular glands, and parotid glands. No cervical lymphadenopathy. Aortic arch: Stable soft greater than calcified arch and great vessel origin atherosclerosis. Right carotid system: Stable anterior right CCA soft plaque. Right carotid bifurcation remains patent with soft plaque in the right ICA bulb but no cervical right ICA stenosis. Left carotid system: Stable left CCA mild soft plaque in patency. Soft and calcified plaque is stable up the left ICA origin and bulb without hemodynamically significant stenosis. Vertebral arteries: Stable stenosis of the proximal right subclavian artery. Stable mild right vertebral artery origin stenosis. Negative right vertebral artery otherwise to the skullbase. Stable proximal left subclavian soft and calcified plaque without hemodynamically significant stenosis. Stable moderate left vertebral artery origin stenosis. The left vertebral artery otherwise is negative to the skullbase. CTA HEAD Posterior circulation: Stable distal vertebral arteries without stenosis. Both PICA origins remain patent. Patent vertebrobasilar junction. Patent basilar artery without stenosis. Stable SCA and PCA origins. Posterior communicating arteries are diminutive or absent. Bilateral PCA branches are stable. Anterior circulation: Both ICA siphons remain patent, but the left is diminutive as before. There is cavernous segment calcified plaque on the right which is stable with up to moderate stenosis. The right ICA terminus appears stable and normal. The left ICA terminus is highly stenotic and diminutive as before. The left MCA and ACA origins remain highly stenotic but patent, the left A1 segment and proximal left M1 are thread-like as before. More distally appears stable since April. The  left MCA Left M1 patency bifurcation remains patent. Left MCA branches are stable. More normal appearing right MCA M1 and ACA A1 segments. Anterior communicating artery remains normal. Bilateral ACA branches are stable and normal. Aneurysm clips at the right MCA trifurcation again noted with subsequent streak artifact. Visible right MCA branches are stable and within normal limits. Venous sinuses: Patent. Anatomic variants: None. Delayed phase: No abnormal enhancement identified. IMPRESSION: 1. Negative for emergent large vessel occlusion. 2. Stable CTA head and neck findings since April remarkable for - high-grade stenosis Left ICA terminus, Left MCA and ACA origins. - diminutive but patent Left MCA M1 segment. Left ACA A2 and distal branches satisfactorily supplied by the anterior communicating artery. - stable CT appearance of Right MCA bifurcation aneurysm clipping. - mild-to-moderate proximal right subclavian and left greater than right vertebral artery origin stenoses. - age advanced soft and calcified atherosclerotic plaque in the head and neck. 3. Stable CT appearance of the brain. No acute intracranial abnormality identified. Electronically Signed   By: Genevie Ann M.D.   On: 03/05/2016 15:41   Ct Angio Neck W Or Wo Contrast  Result Date: 03/05/2016 CLINICAL DATA:  50 year old female with expressive aphasia. Initial encounter. history of left middle cerebral artery symptomatic stenosis status post percutaneous. Previous right MCA bifurcation aneurysm clipping. intracranial angioplasty. Moderate to severe left ICA terminus and proximal left MCA stenosis by CTA in April this year. EXAM: CT ANGIOGRAPHY HEAD AND NECK TECHNIQUE: Multidetector CT imaging of the head and neck was performed using the standard protocol during  bolus administration of intravenous contrast. Multiplanar CT image reconstructions and MIPs were obtained to evaluate the vascular anatomy. Carotid stenosis measurements (when applicable) are  obtained utilizing NASCET criteria, using the distal internal carotid diameter as the denominator. CONTRAST:  50 mL Isovue 370. COMPARISON:  CTA head and neck 10/27/2015, and earlier. FINDINGS: CT HEAD Brain: Right middle cranial fossa aneurysm clips appear stable in configuration. Mild streak artifact. Stable mild left frontal lobe cortical encephalomalacia. Occasional scattered left greater than right white matter hypodensity appears stable. No acute intracranial hemorrhage identified. No midline shift, mass effect, or evidence of intracranial mass lesion. No cortically based acute infarct identified. Calvarium and skull base: Stable sequelae of right frontotemporal craniotomy. Stable visualized osseous structures. Paranasal sinuses: Visualized paranasal sinuses and mastoids are stable and well pneumatized. Orbits: No acute orbit or scalp soft tissue findings. CTA NECK Skeleton:  No acute osseous abnormality identified. Other neck: Negative lung apices. No superior mediastinal lymphadenopathy. Stable thyroid, larynx, pharynx, parapharyngeal spaces, retropharyngeal space, sublingual space, submandibular glands, and parotid glands. No cervical lymphadenopathy. Aortic arch: Stable soft greater than calcified arch and great vessel origin atherosclerosis. Right carotid system: Stable anterior right CCA soft plaque. Right carotid bifurcation remains patent with soft plaque in the right ICA bulb but no cervical right ICA stenosis. Left carotid system: Stable left CCA mild soft plaque in patency. Soft and calcified plaque is stable up the left ICA origin and bulb without hemodynamically significant stenosis. Vertebral arteries: Stable stenosis of the proximal right subclavian artery. Stable mild right vertebral artery origin stenosis. Negative right vertebral artery otherwise to the skullbase. Stable proximal left subclavian soft and calcified plaque without hemodynamically significant stenosis. Stable moderate left  vertebral artery origin stenosis. The left vertebral artery otherwise is negative to the skullbase. CTA HEAD Posterior circulation: Stable distal vertebral arteries without stenosis. Both PICA origins remain patent. Patent vertebrobasilar junction. Patent basilar artery without stenosis. Stable SCA and PCA origins. Posterior communicating arteries are diminutive or absent. Bilateral PCA branches are stable. Anterior circulation: Both ICA siphons remain patent, but the left is diminutive as before. There is cavernous segment calcified plaque on the right which is stable with up to moderate stenosis. The right ICA terminus appears stable and normal. The left ICA terminus is highly stenotic and diminutive as before. The left MCA and ACA origins remain highly stenotic but patent, the left A1 segment and proximal left M1 are thread-like as before. More distally appears stable since April. The left MCA Left M1 patency bifurcation remains patent. Left MCA branches are stable. More normal appearing right MCA M1 and ACA A1 segments. Anterior communicating artery remains normal. Bilateral ACA branches are stable and normal. Aneurysm clips at the right MCA trifurcation again noted with subsequent streak artifact. Visible right MCA branches are stable and within normal limits. Venous sinuses: Patent. Anatomic variants: None. Delayed phase: No abnormal enhancement identified. IMPRESSION: 1. Negative for emergent large vessel occlusion. 2. Stable CTA head and neck findings since April remarkable for - high-grade stenosis Left ICA terminus, Left MCA and ACA origins. - diminutive but patent Left MCA M1 segment. Left ACA A2 and distal branches satisfactorily supplied by the anterior communicating artery. - stable CT appearance of Right MCA bifurcation aneurysm clipping. - mild-to-moderate proximal right subclavian and left greater than right vertebral artery origin stenoses. - age advanced soft and calcified atherosclerotic plaque  in the head and neck. 3. Stable CT appearance of the brain. No acute intracranial abnormality identified. Electronically Signed  By: Genevie Ann M.D.   On: 03/05/2016 15:41   2D Echocardiogram  - Left ventricle: The cavity size was normal. Wall thickness was normal. Systolic function was normal. The estimated ejection fraction was in the range of 55% to 60%. Wall motion was normal; there were no regional wall motion abnormalities. Doppler parameters are consistent with abnormal left ventricular relaxation (grade 1 diastolic dysfunction). The E/e&' ratio is <8, suggesting normal LV filling pressure. - Left atrium: The atrium was normal in size. - Inferior vena cava: The vessel was normal in size. The respirophasic diameter changes were in the normal range (>= 50%), consistent with normal central venous pressure. Impressions:   LVEF 55-60%, normal wall thickness, normal wall motion, diastolic  dysfunction with normal LV filling pressure, normal LA size, normal IVC.   PHYSICAL EXAM:  . Afebrile. Head is nontraumatic. Neck is supple without bruit.    Cardiac exam no murmur or gallop. Lungs are clear to auscultation. Distal pulses are well felt. Neurological Exam : Pleasant awake alert oriented 3 bizarre speech  with fluctuating intermittent expressive language difficulties on other occasion speech been quite fluent. No problems with naming, repetition and comprehension.  Pupils equal reactive. Vision acuity seems adequate. Fundi were not visualized. Visual fields are full to bedside confrontational testing. Face is symmetric without weakness. Tongue is midline. Motor system exam reveals no upper extremity drift. Symmetric and equal strength in all 4 extremities. No focal weakness. Sensation is preserved bilaterally. Deep tendon reflexes are 1+ symmetric. Plantars are downgoing. Gait was not tested.  ASSESSMENT/PLAN [name] is a 50 y.o. 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 She did not receive IV t-PA due to unknown LKW.   Stroke vs TIA in setting of high grade L MCA M1 restenosis  Resultant  Expressive aphasia  CTA head and neck no large vessel occlusion, stable CTA since April (high grade stenosis L ICA terminus, origin L MCA and ACA. Mild to mod prox R SCA and L>R VA origin stenosis). CT stable.  Cerebral angio planned to further evaluate stenosis   MRI  pending   2D Echo  EF 55-60%. No source of embolus   LDL 92  HgbA1c pending  Lovenox 40 mg sq daily for VTE prophylaxis Diet NPO time specified  aspirin 81 mg daily and clopidogrel 75 mg daily prior to admission, now on aspirin 81 mg daily and clopidogrel 75 mg daily  Patient counseled to be compliant with her antithrombotic medications  Ongoing aggressive stroke risk factor management  Therapy recommendations:  No OT or PT needs  Disposition:  Return home  Hypertension  Stable  Long-term BP goal normotensive  Hyperlipidemia  Home meds:  lipitor 40 mg daily, resumed in hospital  LDL 92, goal < 70  Continue statin at discharge  Diabetes  HgbA1c pending, goal < 7.0  Other Stroke Risk Factors  Cigarette smoker, strongly advised to stop smoking  There is no height or weight on file to calculate BMI.   Hx stroke/TIA  01/2015 - L MCA and L ICA angioplasty 02/09/2015, recheck angio 06/05/2015 appox 65-70% restenosis at site of previous angioplastied L M1 and supraclinoid L ICA  05/2014 TIA, stroke x 7 - memory loss, expressive aphaisa, L sided  weakness  2007 Cerebral aneurysm s/p clipping   Family hx aneurysms  Chronic Migraines  Other Active  Problems  Anxiety/depression/Bipolar/PTSD  GERD  COPD  Hospital day # [place] for Pager information 03/06/2016 12:39 PM   I have personally examined this patient, reviewed notes, independently viewed imaging studies, participated in medical decision making and plan of care. I have made any additions or clarifications directly to the above note. Agree with note above.  She presented with recurrent transient episodes of expressive speech difficulties of unclear etiology. Possibilities include left hemispheric TIAs. Simple partial seizure less likely. Contribution from underlying anxiety/stress also possible. She remains at risk for recurrent stroke and TIAs given has severe left terminal ICA restenosis following prior angioplasty. Patient strongly advised to quit smoking completely and maintain aggressive risk factor modification with strict control of hypertension with blood pressure goal below 130/90, lipids with LDL cholesterol goal below 70 mg percent and diabetes with hemoglobin A1c goal below 6.5. Case discussed with Dr. Estanislado Pandy. He is unable to do diagnostic cerebral angiogram today. Recommend check MRI scan the brain and discharge the patient home today on aspirin and Plavix with outpatient follow-up with Dr. Estanislado Pandy in 1 week. Discussed with family practice teaching service medical resident. Greater than 50% time during this 35 minute visit was spent on counseling and coordination of care about stroke risk, prevention and treatment Follow-up as an outpatient with stroke team in 2 months. Stroke team will sign off. Kindly call for questions.  Antony Contras, MD Medical Director [facility] Pager: [phone] 03/06/2016 2:47 PM   To contact Stroke Continuity provider, please refer to [email or web address] After hours, contact General Neurology

## 2016-03-06 NOTE — Progress Notes (Signed)
Family Medicine Teaching Service Daily Progress Note Intern Pager: [phone]  Patient name: Jill Taylor Medical record number: [id number] Date of birth: September 30, 1965 Age: 50 y.o. Gender: female  Primary Care Provider: Imagene Riches, NP Consultants: Neurology Code Status: FULL  Pt Overview and Major Events to Date:  8/21 admission  Assessment and Plan: Jill Taylor is a 50 y.o. female presenting with slurred speech and worsened expressive aphasia. 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   #Slurred Speech/Worsened Expressive Aphasia: symptoms started 8/16 and are improving. CTA head and neck in the ED showed no acute intracranial abnormality. Also showed stable findings of high grade stenosis of L ICA terminus, Left MCA and ACA origins; diminutive but patent L MCA M1 segment; stable R MCA bifurcation aneurysm clipping; mid-to-mod proximal right subclavian and left greater than right vertebral artery origin stenosis; age advanced soft and calcified atherosclerotic plaque in head and neck.  Has RF: hx of CVA and TIA, DM, HLD, HTN, tobacco use. Stroke risk factors include: HLD and HTN. Not a candidate for TPA due to the fact that her symptoms had been present for several days. Lipid panel shows LDL 92. ECHO 8/22 LVEF 55-60%, normal wall thickness, normal wall motion, diastolic dysfunction with normal LV filling pressure, normal LA size - neuro checks per protocol -Scheduled for cerebral arteriogram today - MRI brain wo contrast to eval for CVA  - neurology consulted, appreciate reccs: request angioplasty 8/22 unless MRI negative (went NPO at midnight)  - passed swallow screen: heart healthy carb mod diet -  continue Aspirin 81mg  and Plavix 75 mg daily - continue Lipitor 40mg   -Hba1c: pending -PT/OT consulted  #DM2: no recent A1c on file (last A1c 7 in 10/2014). Home meds: Invokana, Glipizide, Trulicity  - [id number] ordered, pending - CBGs ACHS with Mod SSI  - holding home anti-hyperglycemic meds - continue home Lyrica 200mg  TID  #Anxiety/Depression/Bipolar DO: Takes Klonopin PRN and has not needed recently - continue home meds: Buspar 10mg  BID, Seroquel 25mg  BID -  Will hold home Klonopin for now and start if needed  #HTN: Home med: Lisinopril 2.5mg  daily. Normotensive. BPs 115/75-80s this am - holding Lisinopril  #HLD:  - Lipitor  #Migraines:  - continue home Notryptyline 25mg  daily  - continue home Magnesium 250mg  TID   #GERD: - home Prilosec  #COPD: stable  - home Breo   FEN/GI: heart healthy carb mod > NPO at midnight, SLIV Prophylaxis: Lovenox   Disposition: admitted to tele, attending Dr. McDiarmid  Subjective:  Speech improving throughout course of the day. Some residual left sided weakness. Anxious to go home but was amenable to staying once updated.  Objective: Temp:  [97.5 F (36.4 C)-98.4 F (36.9 C)] 98.4 F (36.9 C) (08/22 0523) Pulse Rate:  [65-101] 83 (08/22 0523) Resp:  [16-22] 18 (08/22 0523) BP: (108-169)/(60-98) 115/75 (08/22 0523) SpO2:  [93 %-100 %] 94 % (08/22 0523) Physical Exam:  GEN: NAD HEENT: Atraumatic, normocephalic, neck supple, EOMI, sclera clear Neck: no carotid bruits  CV: RRR, no murmurs, rubs, or gallops PULM: CTAB, normal effort ABD: Soft, nontender, nondistended, +bs, no organomegaly SKIN: No rash or cyanosis; warm and well-perfused EXTR: No lower extremity edema or calf tenderness PSYCH: Mood and affect euthymic NEURO: AAOx3, speech  less slurred, states she is not at her baseline yet. Strength: 5/5 bilateral UE, 5/5 RLE, 4-5/5 LLE. Normal sensation to light touch. Cerebellar- finger to nose wnl  Laboratory:  Recent  Labs Lab 03/05/16 1308 03/05/16 1331  WBC 9.9  --   HGB 15.1* 16.3*  HCT 47.0* 48.0*  PLT 202  --     Recent Labs Lab 03/05/16 1308 03/05/16 1331  NA 133* 135  K 4.0 3.9  CL 102 100*  CO2 22  --   BUN 12 14  CREATININE 0.88 0.70  CALCIUM 9.2  --   PROT 7.0  --   BILITOT 0.9  --   ALKPHOS 145*  --   ALT 22  --   AST 20  --   GLUCOSE 219* 222*   Imaging/Diagnostic Tests:  Ct Angio Head W Or Wo Contrast  Result Date: 03/05/2016 CLINICAL DATA:  50 year old female with expressive aphasia. Initial encounter. history of left middle cerebral artery symptomatic stenosis status post percutaneous. Previous right MCA bifurcation aneurysm clipping. intracranial angioplasty. Moderate to severe left ICA terminus and proximal left MCA stenosis by CTA in April this year. EXAM: CT ANGIOGRAPHY HEAD AND NECK TECHNIQUE: Multidetector CT imaging of the head and neck was performed using the standard protocol during bolus administration of intravenous contrast. Multiplanar CT image reconstructions and MIPs were obtained to evaluate the vascular anatomy. Carotid stenosis measurements (when applicable) are obtained utilizing NASCET criteria, using the distal internal carotid diameter as the denominator. CONTRAST:  50 mL Isovue 370. COMPARISON:  CTA head and neck 10/27/2015, and earlier. FINDINGS: CT HEAD Brain: Right middle cranial fossa aneurysm clips appear stable in configuration. Mild streak artifact. Stable mild left frontal lobe cortical encephalomalacia. Occasional scattered left greater than right white matter hypodensity appears stable. No acute intracranial hemorrhage identified. No midline shift, mass effect, or evidence of intracranial mass lesion. No cortically based acute infarct identified. Calvarium and skull base: Stable sequelae of right frontotemporal craniotomy. Stable visualized osseous structures. Paranasal sinuses: Visualized paranasal sinuses and mastoids are stable and well  pneumatized. Orbits: No acute orbit or scalp soft tissue findings. CTA NECK Skeleton:  No acute osseous abnormality identified. Other neck: Negative lung apices. No superior mediastinal lymphadenopathy. Stable thyroid, larynx, pharynx, parapharyngeal spaces, retropharyngeal space, sublingual space, submandibular glands, and parotid glands. No cervical lymphadenopathy. Aortic arch: Stable soft greater than calcified arch and great vessel origin atherosclerosis. Right carotid system: Stable anterior right CCA soft plaque. Right carotid bifurcation remains patent with soft plaque in the right ICA bulb but no cervical right ICA stenosis. Left carotid system: Stable left CCA mild soft plaque in patency. Soft and calcified plaque is stable up the left ICA origin and bulb without hemodynamically significant stenosis. Vertebral arteries: Stable stenosis of the proximal right subclavian artery. Stable mild right vertebral artery origin stenosis. Negative right vertebral artery otherwise to the skullbase. Stable proximal left subclavian soft and calcified plaque without hemodynamically significant stenosis. Stable moderate left vertebral artery origin stenosis. The left vertebral artery otherwise is negative to the skullbase. CTA HEAD Posterior circulation: Stable distal vertebral arteries without stenosis. Both PICA origins remain patent. Patent vertebrobasilar junction. Patent basilar artery without stenosis. Stable SCA and PCA origins. Posterior communicating arteries are diminutive or absent. Bilateral PCA branches are stable. Anterior circulation: Both ICA siphons remain patent, but the left is diminutive as before. There is cavernous segment calcified plaque on the right which is stable with up to moderate stenosis. The right ICA terminus appears stable  and normal. The left ICA terminus is highly stenotic and diminutive as before. The left MCA and ACA origins remain highly stenotic but patent, the left A1 segment and  proximal left M1 are thread-like as before. More distally appears stable since April. The left MCA Left M1 patency bifurcation remains patent. Left MCA branches are stable. More normal appearing right MCA M1 and ACA A1 segments. Anterior communicating artery remains normal. Bilateral ACA branches are stable and normal. Aneurysm clips at the right MCA trifurcation again noted with subsequent streak artifact. Visible right MCA branches are stable and within normal limits. Venous sinuses: Patent. Anatomic variants: None. Delayed phase: No abnormal enhancement identified. IMPRESSION: 1. Negative for emergent large vessel occlusion. 2. Stable CTA head and neck findings since April remarkable for - high-grade stenosis Left ICA terminus, Left MCA and ACA origins. - diminutive but patent Left MCA M1 segment. Left ACA A2 and distal branches satisfactorily supplied by the anterior communicating artery. - stable CT appearance of Right MCA bifurcation aneurysm clipping. - mild-to-moderate proximal right subclavian and left greater than right vertebral artery origin stenoses. - age advanced soft and calcified atherosclerotic plaque in the head and neck. 3. Stable CT appearance of the brain. No acute intracranial abnormality identified. Electronically Signed   By: Genevie Ann M.D.   On: 03/05/2016 15:41   Ct Angio Neck W Or Wo Contrast  Result Date: 03/05/2016 CLINICAL DATA:  50 year old female with expressive aphasia. Initial encounter. history of left middle cerebral artery symptomatic stenosis status post percutaneous. Previous right MCA bifurcation aneurysm clipping. intracranial angioplasty. Moderate to severe left ICA terminus and proximal left MCA stenosis by CTA in April this year. EXAM: CT ANGIOGRAPHY HEAD AND NECK TECHNIQUE: Multidetector CT imaging of the head and neck was performed using the standard protocol during bolus administration of intravenous contrast. Multiplanar CT image reconstructions and MIPs were  obtained to evaluate the vascular anatomy. Carotid stenosis measurements (when applicable) are obtained utilizing NASCET criteria, using the distal internal carotid diameter as the denominator. CONTRAST:  50 mL Isovue 370. COMPARISON:  CTA head and neck 10/27/2015, and earlier. FINDINGS: CT HEAD Brain: Right middle cranial fossa aneurysm clips appear stable in configuration. Mild streak artifact. Stable mild left frontal lobe cortical encephalomalacia. Occasional scattered left greater than right white matter hypodensity appears stable. No acute intracranial hemorrhage identified. No midline shift, mass effect, or evidence of intracranial mass lesion. No cortically based acute infarct identified. Calvarium and skull base: Stable sequelae of right frontotemporal craniotomy. Stable visualized osseous structures. Paranasal sinuses: Visualized paranasal sinuses and mastoids are stable and well pneumatized. Orbits: No acute orbit or scalp soft tissue findings. CTA NECK Skeleton:  No acute osseous abnormality identified. Other neck: Negative lung apices. No superior mediastinal lymphadenopathy. Stable thyroid, larynx, pharynx, parapharyngeal spaces, retropharyngeal space, sublingual space, submandibular glands, and parotid glands. No cervical lymphadenopathy. Aortic arch: Stable soft greater than calcified arch and great vessel origin atherosclerosis. Right carotid system: Stable anterior right CCA soft plaque. Right carotid bifurcation remains patent with soft plaque in the right ICA bulb but no cervical right ICA stenosis. Left carotid system: Stable left CCA mild soft plaque in patency. Soft and calcified plaque is stable up the left ICA origin and bulb without hemodynamically significant stenosis. Vertebral arteries: Stable stenosis of the proximal right subclavian artery. Stable mild right vertebral artery origin stenosis. Negative right vertebral artery otherwise to the skullbase. Stable proximal left subclavian  soft and calcified plaque without hemodynamically significant stenosis. Stable moderate left  vertebral artery origin stenosis. The left vertebral artery otherwise is negative to the skullbase. CTA HEAD Posterior circulation: Stable distal vertebral arteries without stenosis. Both PICA origins remain patent. Patent vertebrobasilar junction. Patent basilar artery without stenosis. Stable SCA and PCA origins. Posterior communicating arteries are diminutive or absent. Bilateral PCA branches are stable. Anterior circulation: Both ICA siphons remain patent, but the left is diminutive as before. There is cavernous segment calcified plaque on the right which is stable with up to moderate stenosis. The right ICA terminus appears stable and normal. The left ICA terminus is highly stenotic and diminutive as before. The left MCA and ACA origins remain highly stenotic but patent, the left A1 segment and proximal left M1 are thread-like as before. More distally appears stable since April. The left MCA Left M1 patency bifurcation remains patent. Left MCA branches are stable. More normal appearing right MCA M1 and ACA A1 segments. Anterior communicating artery remains normal. Bilateral ACA branches are stable and normal. Aneurysm clips at the right MCA trifurcation again noted with subsequent streak artifact. Visible right MCA branches are stable and within normal limits. Venous sinuses: Patent. Anatomic variants: None. Delayed phase: No abnormal enhancement identified. IMPRESSION: 1. Negative for emergent large vessel occlusion. 2. Stable CTA head and neck findings since April remarkable for - high-grade stenosis Left ICA terminus, Left MCA and ACA origins. - diminutive but patent Left MCA M1 segment. Left ACA A2 and distal branches satisfactorily supplied by the anterior communicating artery. - stable CT appearance of Right MCA bifurcation aneurysm clipping. - mild-to-moderate proximal right subclavian and left greater than right  vertebral artery origin stenoses. - age advanced soft and calcified atherosclerotic plaque in the head and neck. 3. Stable CT appearance of the brain. No acute intracranial abnormality identified. Electronically Signed   By: Genevie Ann M.D.   On: 03/05/2016 15:41   Ct Angio Head W Or Wo Contrast  Result Date: 03/05/2016 CLINICAL DATA:  50 year old female with expressive aphasia. Initial encounter. history of left middle cerebral artery symptomatic stenosis status post percutaneous. Previous right MCA bifurcation aneurysm clipping. intracranial angioplasty. Moderate to severe left ICA terminus and proximal left MCA stenosis by CTA in April this year. EXAM: CT ANGIOGRAPHY HEAD AND NECK TECHNIQUE: Multidetector CT imaging of the head and neck was performed using the standard protocol during bolus administration of intravenous contrast. Multiplanar CT image reconstructions and MIPs were obtained to evaluate the vascular anatomy. Carotid stenosis measurements (when applicable) are obtained utilizing NASCET criteria, using the distal internal carotid diameter as the denominator. CONTRAST:  50 mL Isovue 370. COMPARISON:  CTA head and neck 10/27/2015, and earlier. FINDINGS: CT HEAD Brain: Right middle cranial fossa aneurysm clips appear stable in configuration. Mild streak artifact. Stable mild left frontal lobe cortical encephalomalacia. Occasional scattered left greater than right white matter hypodensity appears stable. No acute intracranial hemorrhage identified. No midline shift, mass effect, or evidence of intracranial mass lesion. No cortically based acute infarct identified. Calvarium and skull base: Stable sequelae of right frontotemporal craniotomy. Stable visualized osseous structures. Paranasal sinuses: Visualized paranasal sinuses and mastoids are stable and well pneumatized. Orbits: No acute orbit or scalp soft tissue findings. CTA NECK Skeleton:  No acute osseous abnormality identified. Other neck: Negative  lung apices. No superior mediastinal lymphadenopathy. Stable thyroid, larynx, pharynx, parapharyngeal spaces, retropharyngeal space, sublingual space, submandibular glands, and parotid glands. No cervical lymphadenopathy. Aortic arch: Stable soft greater than calcified arch and great vessel origin atherosclerosis. Right carotid system: Stable anterior right  CCA soft plaque. Right carotid bifurcation remains patent with soft plaque in the right ICA bulb but no cervical right ICA stenosis. Left carotid system: Stable left CCA mild soft plaque in patency. Soft and calcified plaque is stable up the left ICA origin and bulb without hemodynamically significant stenosis. Vertebral arteries: Stable stenosis of the proximal right subclavian artery. Stable mild right vertebral artery origin stenosis. Negative right vertebral artery otherwise to the skullbase. Stable proximal left subclavian soft and calcified plaque without hemodynamically significant stenosis. Stable moderate left vertebral artery origin stenosis. The left vertebral artery otherwise is negative to the skullbase. CTA HEAD Posterior circulation: Stable distal vertebral arteries without stenosis. Both PICA origins remain patent. Patent vertebrobasilar junction. Patent basilar artery without stenosis. Stable SCA and PCA origins. Posterior communicating arteries are diminutive or absent. Bilateral PCA branches are stable. Anterior circulation: Both ICA siphons remain patent, but the left is diminutive as before. There is cavernous segment calcified plaque on the right which is stable with up to moderate stenosis. The right ICA terminus appears stable and normal. The left ICA terminus is highly stenotic and diminutive as before. The left MCA and ACA origins remain highly stenotic but patent, the left A1 segment and proximal left M1 are thread-like as before. More distally appears stable since April. The left MCA Left M1 patency bifurcation remains patent. Left MCA  branches are stable. More normal appearing right MCA M1 and ACA A1 segments. Anterior communicating artery remains normal. Bilateral ACA branches are stable and normal. Aneurysm clips at the right MCA trifurcation again noted with subsequent streak artifact. Visible right MCA branches are stable and within normal limits. Venous sinuses: Patent. Anatomic variants: None. Delayed phase: No abnormal enhancement identified. IMPRESSION: 1. Negative for emergent large vessel occlusion. 2. Stable CTA head and neck findings since April remarkable for - high-grade stenosis Left ICA terminus, Left MCA and ACA origins. - diminutive but patent Left MCA M1 segment. Left ACA A2 and distal branches satisfactorily supplied by the anterior communicating artery. - stable CT appearance of Right MCA bifurcation aneurysm clipping. - mild-to-moderate proximal right subclavian and left greater than right vertebral artery origin stenoses. - age advanced soft and calcified atherosclerotic plaque in the head and neck. 3. Stable CT appearance of the brain. No acute intracranial abnormality identified. Electronically Signed   By: Genevie Ann M.D.   On: 03/05/2016 15:41   Ct Angio Neck W Or Wo Contrast  Result Date: 03/05/2016 CLINICAL DATA:  50 year old female with expressive aphasia. Initial encounter. history of left middle cerebral artery symptomatic stenosis status post percutaneous. Previous right MCA bifurcation aneurysm clipping. intracranial angioplasty. Moderate to severe left ICA terminus and proximal left MCA stenosis by CTA in April this year. EXAM: CT ANGIOGRAPHY HEAD AND NECK TECHNIQUE: Multidetector CT imaging of the head and neck was performed using the standard protocol during bolus administration of intravenous contrast. Multiplanar CT image reconstructions and MIPs were obtained to evaluate the vascular anatomy. Carotid stenosis measurements (when applicable) are obtained utilizing NASCET criteria, using the distal internal  carotid diameter as the denominator. CONTRAST:  50 mL Isovue 370. COMPARISON:  CTA head and neck 10/27/2015, and earlier. FINDINGS: CT HEAD Brain: Right middle cranial fossa aneurysm clips appear stable in configuration. Mild streak artifact. Stable mild left frontal lobe cortical encephalomalacia. Occasional scattered left greater than right white matter hypodensity appears stable. No acute intracranial hemorrhage identified. No midline shift, mass effect, or evidence of intracranial mass lesion. No cortically based acute infarct identified.  Calvarium and skull base: Stable sequelae of right frontotemporal craniotomy. Stable visualized osseous structures. Paranasal sinuses: Visualized paranasal sinuses and mastoids are stable and well pneumatized. Orbits: No acute orbit or scalp soft tissue findings. CTA NECK Skeleton:  No acute osseous abnormality identified. Other neck: Negative lung apices. No superior mediastinal lymphadenopathy. Stable thyroid, larynx, pharynx, parapharyngeal spaces, retropharyngeal space, sublingual space, submandibular glands, and parotid glands. No cervical lymphadenopathy. Aortic arch: Stable soft greater than calcified arch and great vessel origin atherosclerosis. Right carotid system: Stable anterior right CCA soft plaque. Right carotid bifurcation remains patent with soft plaque in the right ICA bulb but no cervical right ICA stenosis. Left carotid system: Stable left CCA mild soft plaque in patency. Soft and calcified plaque is stable up the left ICA origin and bulb without hemodynamically significant stenosis. Vertebral arteries: Stable stenosis of the proximal right subclavian artery. Stable mild right vertebral artery origin stenosis. Negative right vertebral artery otherwise to the skullbase. Stable proximal left subclavian soft and calcified plaque without hemodynamically significant stenosis. Stable moderate left vertebral artery origin stenosis. The left vertebral artery  otherwise is negative to the skullbase. CTA HEAD Posterior circulation: Stable distal vertebral arteries without stenosis. Both PICA origins remain patent. Patent vertebrobasilar junction. Patent basilar artery without stenosis. Stable SCA and PCA origins. Posterior communicating arteries are diminutive or absent. Bilateral PCA branches are stable. Anterior circulation: Both ICA siphons remain patent, but the left is diminutive as before. There is cavernous segment calcified plaque on the right which is stable with up to moderate stenosis. The right ICA terminus appears stable and normal. The left ICA terminus is highly stenotic and diminutive as before. The left MCA and ACA origins remain highly stenotic but patent, the left A1 segment and proximal left M1 are thread-like as before. More distally appears stable since April. The left MCA Left M1 patency bifurcation remains patent. Left MCA branches are stable. More normal appearing right MCA M1 and ACA A1 segments. Anterior communicating artery remains normal. Bilateral ACA branches are stable and normal. Aneurysm clips at the right MCA trifurcation again noted with subsequent streak artifact. Visible right MCA branches are stable and within normal limits. Venous sinuses: Patent. Anatomic variants: None. Delayed phase: No abnormal enhancement identified. IMPRESSION: 1. Negative for emergent large vessel occlusion. 2. Stable CTA head and neck findings since April remarkable for - high-grade stenosis Left ICA terminus, Left MCA and ACA origins. - diminutive but patent Left MCA M1 segment. Left ACA A2 and distal branches satisfactorily supplied by the anterior communicating artery. - stable CT appearance of Right MCA bifurcation aneurysm clipping. - mild-to-moderate proximal right subclavian and left greater than right vertebral artery origin stenoses. - age advanced soft and calcified atherosclerotic plaque in the head and neck. 3. Stable CT appearance of the brain.  No acute intracranial abnormality identified. Electronically Signed   By: Genevie Ann M.D.   On: 03/05/2016 15:41    Lovenia Kim, MD 03/06/2016, 7:21 AM PGY-1, [place] Intern pager: [phone], text pages welcome

## 2016-03-06 NOTE — Evaluation (Signed)
Physical Therapy Evaluation & Discharge Patient Details Name: Fredericka Giesecke MRN: [id number] DOB: [birth year] Today's Date: 03/06/2016   History of Present Illness  Celestine Singletonis a 50 y.o.femaleadmitted with slurred speech and worsened expressive aphasia. 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   Clinical Impression  Pt was admitted with the above. Pt is at a decreased fall risk as demonstrated by a DGI score of 22, and reports she is back to baseline from a functional mobility standpoint. Lives at home with husband and children who are available for assistance as needed. No follow-up PT recommended, and all acute PT education is completed at this time. PT signing off. Please reconsult in future if necessary.     Follow Up Recommendations No PT follow up    Equipment Recommendations  None recommended by PT    Recommendations for Other Services       Precautions / Restrictions Precautions Precautions: None Restrictions Weight Bearing Restrictions: No      Mobility  Bed Mobility Overal bed mobility: Independent             General bed mobility comments: Pt with good bed mobility at quick speed.  Transfers Overall transfer level: Independent Equipment used: None             General transfer comment: Pt transfered out of bed to go to the bathroom with no difficulties.  Ambulation/Gait Ambulation/Gait assistance: Independent Ambulation Distance (Feet): 300 Feet Assistive device: None Gait Pattern/deviations: WFL(Within Functional Limits);Step-through pattern (toe in gait pattern)   Gait velocity interpretation: at or above normal speed for age/gender    Stairs Stairs:  Yes Stairs assistance: Modified independent (Device/Increase time) Stair Management: One rail Right Number of Stairs: 10 General stair comments: Alternating pattern ascending stairs, step-to pattern descending stairs  Wheelchair Mobility    Modified Rankin (Stroke Patients Only) Modified Rankin (Stroke Patients Only) Pre-Morbid Rankin Score: No significant disability Modified Rankin: No significant disability     Balance Overall balance assessment: Independent                               Standardized Balance Assessment Standardized Balance Assessment : Dynamic Gait Index   Dynamic Gait Index Level Surface: Normal Change in Gait Speed: Normal Gait with Horizontal Head Turns: Mild Impairment Gait with Vertical Head Turns: Normal Gait and Pivot Turn: Normal Step Over Obstacle: Normal Step Around Obstacles: Normal Steps: Mild Impairment Total Score: 22       Pertinent Vitals/Pain Pain Assessment: No/denies pain    Home Living Family/patient expects to be discharged to:: Private residence Living Arrangements: Spouse/significant other;Children Available Help at Discharge: Family Type of Home: Mobile home Home Access: Stairs to enter Entrance Stairs-Rails: Can reach both Entrance Stairs-Number of Steps: 8 Home Layout: One level Home Equipment: Toilet riser      Prior Function Level of Independence: Independent               Hand Dominance   Dominant Hand: Right    Extremity/Trunk Assessment   Upper Extremity Assessment: Overall WFL for tasks assessed           Lower Extremity Assessment: Overall WFL for tasks assessed  Cervical / Trunk Assessment: Normal  Communication   Communication: Expressive difficulties  Cognition Arousal/Alertness: Awake/alert Behavior During Therapy: WFL for tasks assessed/performed Overall Cognitive Status: Within Functional Limits for tasks assessed                      General Comments  General comments (skin integrity, edema, etc.): Pt reports today is a good day for her speech and word finding abilities.  She states that this tends to come and go, but seems to be improving. Pt reports residual L sided weakness due to previous stroke and decreased L LE sensation due to PVD, however this is baseline for her. Pt able to take herself to bahtroom and perform hygiene independently.    Exercises        Assessment/Plan    PT Assessment Patent does not need any further PT services  PT Diagnosis Abnormality of gait   PT Problem List    PT Treatment Interventions     PT Goals (Current goals can be found in the Care Plan section) Acute Rehab PT Goals Patient Stated Goal: to return home to her family    Frequency     Barriers to discharge        Co-evaluation               End of Session Equipment Utilized During Treatment: Gait belt Activity Tolerance: Patient tolerated treatment well Patient left: Other (comment) (taken for tests)           Time: 0802-0816 PT Time Calculation (min) (ACUTE ONLY): 14 min   Charges:   PT Evaluation $PT Eval Moderate Complexity: 1 Procedure     PT G Codes:        Calven Gilkes 03-19-2016, 9:40 AM  Tawni Millers, SPT (student physical therapist) [place] [phone]

## 2016-03-06 NOTE — Progress Notes (Signed)
  Echocardiogram 2D Echocardiogram has been performed.  Jill Taylor 03/06/2016, 9:02 AM

## 2016-03-06 NOTE — Progress Notes (Signed)
OT Cancellation Note  Patient Details Name: Jill Taylor MRN: [id number] DOB: November 10, 1965   Cancelled Treatment:    Reason Eval/Treat Not Completed: OT screened, no needs identified, will sign off. Thank you for the referral. Please re-consult as needed.  Phineas Semen, MS, OTR/L 03/06/2016, 11:47 AM

## 2016-03-07 ENCOUNTER — Other Ambulatory Visit: Payer: Self-pay | Admitting: Student in an Organized Health Care Education/Training Program

## 2016-03-07 LAB — HEMOGLOBIN A1C
Hgb A1c MFr Bld: 8.8 % — ABNORMAL HIGH (ref 4.8–5.6)
MEAN PLASMA GLUCOSE: 206 mg/dL

## 2016-03-07 LAB — CBC
HEMATOCRIT: 42.8 % (ref 36.0–46.0)
HEMOGLOBIN: 13.7 g/dL (ref 12.0–15.0)
MCH: 28.3 pg (ref 26.0–34.0)
MCHC: 32 g/dL (ref 30.0–36.0)
MCV: 88.4 fL (ref 78.0–100.0)
Platelets: 177 10*3/uL (ref 150–400)
RBC: 4.84 MIL/uL (ref 3.87–5.11)
RDW: 15.3 % (ref 11.5–15.5)
WBC: 7.2 10*3/uL (ref 4.0–10.5)

## 2016-03-07 LAB — GLUCOSE, CAPILLARY
GLUCOSE-CAPILLARY: 171 mg/dL — AB (ref 65–99)
GLUCOSE-CAPILLARY: 274 mg/dL — AB (ref 65–99)
Glucose-Capillary: 206 mg/dL — ABNORMAL HIGH (ref 65–99)

## 2016-03-07 LAB — BASIC METABOLIC PANEL
ANION GAP: 8 (ref 5–15)
BUN: 12 mg/dL (ref 6–20)
CALCIUM: 8.8 mg/dL — AB (ref 8.9–10.3)
CO2: 24 mmol/L (ref 22–32)
Chloride: 105 mmol/L (ref 101–111)
Creatinine, Ser: 0.8 mg/dL (ref 0.44–1.00)
GFR calc non Af Amer: >60 mL/min (ref >60)
Glucose, Bld: 190 mg/dL — ABNORMAL HIGH (ref 65–99)
POTASSIUM: 4.1 mmol/L (ref 3.5–5.1)
Sodium: 137 mmol/L (ref 135–145)

## 2016-03-07 MED ORDER — CLONAZEPAM 0.5 MG PO TABS: 0.5000 mg | ORAL_TABLET | Freq: Two times a day (BID) | ORAL | 0 refills | Status: AC | PRN

## 2016-03-07 MED ORDER — ATORVASTATIN CALCIUM 40 MG PO TABS: 80.0000 mg | ORAL_TABLET | Freq: Every day | ORAL | 0 refills | Status: AC

## 2016-03-07 NOTE — Progress Notes (Signed)
Family Medicine Teaching Service Daily Progress Note Intern Pager: [phone]  Patient name: Jill Taylor Medical record number: [id number] Date of birth: 1965-10-05 Age: 50 y.o. Gender: female  Primary Care Provider: Imagene Riches, NP Consultants: Neurology Code Status: FULL  Pt Overview and Major Events to Date:  03/05/2016  Admission to FPTS   Assessment and Plan: Jill Taylor a 50 y.o.femalepresenting with slurred speech and worsened expressive aphasia. 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    #L MCA Infarcts with expressive aphasia: Pt is s/p Left ICA angioplasty in 2016, after which time her speech seemed to improve with intermittent expressive aphasia, however 02/29/16 she awoke with expressive aphasia worsened, came in to the hospital outside the window of tPA. Aphasia has improved since 8/16.   CTA head and neck was with no large vessel occlusion, stable CTA since April. (Patient with previous high grade stenosis L ICA terminus, origin L MCA and ACA. Mild to mod prox R SCA and L>R VA origin stenosis)   MR brain yesterday showed multiple acute and subacute ischemic infarcts in L MCA territory, with old small left MCA territory infarcts.  Plan with neurology (Dr. Estanislado Pandy) for discharge today and follow up in one week for diagnostic cerebral angiogram and possible angioplasty.   -  Aggressive risk factor modification: BP <130/90, lipids with LDL goal <70 mg percent and diabetes A1c goal <6.5 - continue ASA 81 mg, plavix 75 mg daily - continue lipitor 40 mg - HbA1c pending - PT/OT evaluated, no PT needs at this time, signed off - continue tobacco cessation counseling   #DM2: no recent  A1c on file (last A1c 7 in 10/2014). Home meds: Invokana, Glipizide, Trulicity  - [id number] ordered, pending - CBGs ACHS with Mod SSI  - holding home anti-hyperglycemic meds - continue home Lyrica 200mg  TID  #Anxiety/Depression/Bipolar DO: Takes Klonopin PRN and has not needed recently - continue home meds: Buspar 10mg  BID, Seroquel 25mg  BID - Will hold home Klonopin for now and start if needed  #HTN:Strict BP goal BP <130/90. Home med: Lisinopril 2.5mg  daily. Normotensive. BPs 106-121/60s this am - holding Lisinopril  #HLD:  - Lipitor  #Migraines:  - continue home Notryptyline 25mg  daily  - continue home Magnesium 250mg  TID   #GERD: - home Prilosec  #COPD: stable  - home Breo   FEN/GI: heart healthy carb mod , SLIV Prophylaxis: Lovenox   Disposition: Home no PT requirements  Subjective:  Patient states expressive aphasia continues to be intermittent and is no better or worse today.  She denies headache, dizziness, chest pain, shortness of breath or pain anywhere today.  She denies any new focal deficits.  Objective: Temp:  [97.6 F (36.4 C)-98.1 F (36.7 C)] 97.7 F (36.5 C) (08/23 0508) Pulse Rate:  [68-80] 72 (08/23 0508) Resp:  [18] 18 (08/23 0508) BP: (106-137)/(66-78) 106/68 (08/23 0508) SpO2:  [96 %-100 %] 100 % (08/23 0508) Physical Exam: General: NAD, rests comfortably Cardiovascular: RRR, no m/r/g Respiratory: CTA bil, no W/R/R Abdomen: soft, nontender, nondistended  Extremities: full ROM, no LE edema bil  Laboratory:  Recent Labs Lab 03/05/16 1308 03/05/16 1331 03/06/16 0747 03/07/16 0548  WBC 9.9  --  8.7 7.2  HGB 15.1* 16.3* 14.9 13.7  HCT 47.0* 48.0*  45.6 42.8  PLT 202  --  170 177    Recent Labs Lab 03/05/16 1308 03/05/16 1331 03/06/16 0747 03/07/16 0548  NA 133* 135 138 137  K 4.0 3.9 4.4 4.1  CL 102 100* 104 105  CO2 22  --  24 24  BUN 12 14 12 12   CREATININE 0.88 0.70 0.88 0.80  CALCIUM 9.2  --  9.4 8.8*  PROT 7.0  --   --    --   BILITOT 0.9  --   --   --   ALKPHOS 145*  --   --   --   ALT 22  --   --   --   AST 20  --   --   --   GLUCOSE 219* 222* 165* 190*      Imaging/Diagnostic Tests: Ct Angio Head W Or Wo Contrast  Result Date: 03/05/2016 CLINICAL DATA:  50 year old female with expressive aphasia. Initial encounter. history of left middle cerebral artery symptomatic stenosis status post percutaneous. Previous right MCA bifurcation aneurysm clipping. intracranial angioplasty. Moderate to severe left ICA terminus and proximal left MCA stenosis by CTA in April this year. EXAM: CT ANGIOGRAPHY HEAD AND NECK TECHNIQUE: Multidetector CT imaging of the head and neck was performed using the standard protocol during bolus administration of intravenous contrast. Multiplanar CT image reconstructions and MIPs were obtained to evaluate the vascular anatomy. Carotid stenosis measurements (when applicable) are obtained utilizing NASCET criteria, using the distal internal carotid diameter as the denominator. CONTRAST:  50 mL Isovue 370. COMPARISON:  CTA head and neck 10/27/2015, and earlier. FINDINGS: CT HEAD Brain: Right middle cranial fossa aneurysm clips appear stable in configuration. Mild streak artifact. Stable mild left frontal lobe cortical encephalomalacia. Occasional scattered left greater than right white matter hypodensity appears stable. No acute intracranial hemorrhage identified. No midline shift, mass effect, or evidence of intracranial mass lesion. No cortically based acute infarct identified. Calvarium and skull base: Stable sequelae of right frontotemporal craniotomy. Stable visualized osseous structures. Paranasal sinuses: Visualized paranasal sinuses and mastoids are stable and well pneumatized. Orbits: No acute orbit or scalp soft tissue findings. CTA NECK Skeleton:  No acute osseous abnormality identified. Other neck: Negative lung apices. No superior mediastinal lymphadenopathy. Stable thyroid, larynx, pharynx,  parapharyngeal spaces, retropharyngeal space, sublingual space, submandibular glands, and parotid glands. No cervical lymphadenopathy. Aortic arch: Stable soft greater than calcified arch and great vessel origin atherosclerosis. Right carotid system: Stable anterior right CCA soft plaque. Right carotid bifurcation remains patent with soft plaque in the right ICA bulb but no cervical right ICA stenosis. Left carotid system: Stable left CCA mild soft plaque in patency. Soft and calcified plaque is stable up the left ICA origin and bulb without hemodynamically significant stenosis. Vertebral arteries: Stable stenosis of the proximal right subclavian artery. Stable mild right vertebral artery origin stenosis. Negative right vertebral artery otherwise to the skullbase. Stable proximal left subclavian soft and calcified plaque without hemodynamically significant stenosis. Stable moderate left vertebral artery origin stenosis. The left vertebral artery otherwise is negative to the skullbase. CTA HEAD Posterior circulation: Stable distal vertebral arteries without stenosis. Both PICA origins remain patent. Patent vertebrobasilar junction. Patent basilar artery without stenosis. Stable SCA and PCA origins. Posterior communicating arteries are diminutive or absent. Bilateral PCA branches are stable. Anterior circulation: Both ICA siphons remain patent, but the left is diminutive as before. There is cavernous segment calcified plaque on the right which is stable with up to moderate stenosis. The right  ICA terminus appears stable and normal. The left ICA terminus is highly stenotic and diminutive as before. The left MCA and ACA origins remain highly stenotic but patent, the left A1 segment and proximal left M1 are thread-like as before. More distally appears stable since April. The left MCA Left M1 patency bifurcation remains patent. Left MCA branches are stable. More normal appearing right MCA M1 and ACA A1 segments. Anterior  communicating artery remains normal. Bilateral ACA branches are stable and normal. Aneurysm clips at the right MCA trifurcation again noted with subsequent streak artifact. Visible right MCA branches are stable and within normal limits. Venous sinuses: Patent. Anatomic variants: None. Delayed phase: No abnormal enhancement identified. IMPRESSION: 1. Negative for emergent large vessel occlusion. 2. Stable CTA head and neck findings since April remarkable for - high-grade stenosis Left ICA terminus, Left MCA and ACA origins. - diminutive but patent Left MCA M1 segment. Left ACA A2 and distal branches satisfactorily supplied by the anterior communicating artery. - stable CT appearance of Right MCA bifurcation aneurysm clipping. - mild-to-moderate proximal right subclavian and left greater than right vertebral artery origin stenoses. - age advanced soft and calcified atherosclerotic plaque in the head and neck. 3. Stable CT appearance of the brain. No acute intracranial abnormality identified. Electronically Signed   By: Genevie Ann M.D.   On: 03/05/2016 15:41   Ct Angio Neck W Or Wo Contrast  Result Date: 03/05/2016 CLINICAL DATA:  50 year old female with expressive aphasia. Initial encounter. history of left middle cerebral artery symptomatic stenosis status post percutaneous. Previous right MCA bifurcation aneurysm clipping. intracranial angioplasty. Moderate to severe left ICA terminus and proximal left MCA stenosis by CTA in April this year. EXAM: CT ANGIOGRAPHY HEAD AND NECK TECHNIQUE: Multidetector CT imaging of the head and neck was performed using the standard protocol during bolus administration of intravenous contrast. Multiplanar CT image reconstructions and MIPs were obtained to evaluate the vascular anatomy. Carotid stenosis measurements (when applicable) are obtained utilizing NASCET criteria, using the distal internal carotid diameter as the denominator. CONTRAST:  50 mL Isovue 370. COMPARISON:  CTA head  and neck 10/27/2015, and earlier. FINDINGS: CT HEAD Brain: Right middle cranial fossa aneurysm clips appear stable in configuration. Mild streak artifact. Stable mild left frontal lobe cortical encephalomalacia. Occasional scattered left greater than right white matter hypodensity appears stable. No acute intracranial hemorrhage identified. No midline shift, mass effect, or evidence of intracranial mass lesion. No cortically based acute infarct identified. Calvarium and skull base: Stable sequelae of right frontotemporal craniotomy. Stable visualized osseous structures. Paranasal sinuses: Visualized paranasal sinuses and mastoids are stable and well pneumatized. Orbits: No acute orbit or scalp soft tissue findings. CTA NECK Skeleton:  No acute osseous abnormality identified. Other neck: Negative lung apices. No superior mediastinal lymphadenopathy. Stable thyroid, larynx, pharynx, parapharyngeal spaces, retropharyngeal space, sublingual space, submandibular glands, and parotid glands. No cervical lymphadenopathy. Aortic arch: Stable soft greater than calcified arch and great vessel origin atherosclerosis. Right carotid system: Stable anterior right CCA soft plaque. Right carotid bifurcation remains patent with soft plaque in the right ICA bulb but no cervical right ICA stenosis. Left carotid system: Stable left CCA mild soft plaque in patency. Soft and calcified plaque is stable up the left ICA origin and bulb without hemodynamically significant stenosis. Vertebral arteries: Stable stenosis of the proximal right subclavian artery. Stable mild right vertebral artery origin stenosis. Negative right vertebral artery otherwise to the skullbase. Stable proximal left subclavian soft and calcified plaque without hemodynamically significant stenosis.  Stable moderate left vertebral artery origin stenosis. The left vertebral artery otherwise is negative to the skullbase. CTA HEAD Posterior circulation: Stable distal vertebral  arteries without stenosis. Both PICA origins remain patent. Patent vertebrobasilar junction. Patent basilar artery without stenosis. Stable SCA and PCA origins. Posterior communicating arteries are diminutive or absent. Bilateral PCA branches are stable. Anterior circulation: Both ICA siphons remain patent, but the left is diminutive as before. There is cavernous segment calcified plaque on the right which is stable with up to moderate stenosis. The right ICA terminus appears stable and normal. The left ICA terminus is highly stenotic and diminutive as before. The left MCA and ACA origins remain highly stenotic but patent, the left A1 segment and proximal left M1 are thread-like as before. More distally appears stable since April. The left MCA Left M1 patency bifurcation remains patent. Left MCA branches are stable. More normal appearing right MCA M1 and ACA A1 segments. Anterior communicating artery remains normal. Bilateral ACA branches are stable and normal. Aneurysm clips at the right MCA trifurcation again noted with subsequent streak artifact. Visible right MCA branches are stable and within normal limits. Venous sinuses: Patent. Anatomic variants: None. Delayed phase: No abnormal enhancement identified. IMPRESSION: 1. Negative for emergent large vessel occlusion. 2. Stable CTA head and neck findings since April remarkable for - high-grade stenosis Left ICA terminus, Left MCA and ACA origins. - diminutive but patent Left MCA M1 segment. Left ACA A2 and distal branches satisfactorily supplied by the anterior communicating artery. - stable CT appearance of Right MCA bifurcation aneurysm clipping. - mild-to-moderate proximal right subclavian and left greater than right vertebral artery origin stenoses. - age advanced soft and calcified atherosclerotic plaque in the head and neck. 3. Stable CT appearance of the brain. No acute intracranial abnormality identified. Electronically Signed   By: Genevie Ann M.D.   On:  03/05/2016 15:41   Mr Brain Wo Contrast  Result Date: 03/06/2016 CLINICAL DATA:  Intermittent expressive aphasia, now worsening. Status post LEFT internal carotid artery balloon angioplasty February 09, 2015. History of aneurysm clipping, stroke, headache, hypertension, diabetes, hyperlipidemia. EXAM: MRI HEAD WITHOUT CONTRAST TECHNIQUE: Multiplanar, multiecho pulse sequences of the brain and surrounding structures were obtained without intravenous contrast. COMPARISON:  CT and CTA HEAD March 05, 2016 and MRI head Nov 17, 2014 FINDINGS: INTRACRANIAL CONTENTS: Patchy reduced diffusion LEFT frontotemporal parietal lobes. Patchy low ADC values in the larger more identifiable areas. No susceptibility artifact to suggest hemorrhage. Extensive susceptibility artifact associated with the RIGHT middle cerebral artery bifurcation aneurysm clipping. Old bilateral basal ganglia infarcts with mild ex vacuo patient LEFT frontal horn of the lateral ventricle. Small area LEFT frontal lobe encephalomalacia. Patchy white matter T2 hyperintensities exclusive of the aforementioned abnormality. No abnormal extra-axial fluid collections. Major intracranial vascular flow voids present at skull base. ORBITS: The included ocular globes and orbital contents are non-suspicious. SINUSES: Mild paranasal sinus mucosal thickening. Mastoid air cells are well aerated. SKULL/SOFT TISSUES: No abnormal sellar expansion. No suspicious calvarial bone marrow signal. Craniocervical junction maintained. Susceptibility artifact RIGHT calvarium corresponding to craniotomy. Patient is edentulous. IMPRESSION: Multiple acute and subacute nonhemorrhagic infarcts in LEFT MCA territory. Old small LEFT MCA territory infarcts. Moderate chronic small vessel ischemic disease and old RIGHT basal ganglia lacunar infarct. Status post RIGHT craniotomy for MCA aneurysm clipping. Electronically Signed   By: Elon Alas M.D.   On: 03/06/2016 18:33    Everrett Coombe,  MD 03/07/2016, 7:24 AM PGY-1, [facility] Intern pager:  [phone], text pages welcome

## 2016-03-07 NOTE — Progress Notes (Signed)
Pt being discharged per orders from MD. Pt and family educated on discharge information. Pt and family verbalized understanding of discharge. All questions and concerns were addressed. Pt's IV was removed prior to discharge. Pt exited hospital via ambulation.

## 2016-03-07 NOTE — Care Management Note (Signed)
Case Management Note  Patient Details  Name: Jill Taylor MRN: [id number] Date of Birth: [birth year]  Subjective/Objective:   Pt admitted with CVA. She is from home with spouse.                Action/Plan: Pt discharging home with self care. No further needs per CM.   Expected Discharge Date:                  Expected Discharge Plan:  Home/Self Care  In-House Referral:     Discharge planning Services     Post Acute Care Choice:    Choice offered to:     DME Arranged:    DME Agency:     HH Arranged:    [place] Agency:     Status of Service:  Completed, signed off  If discussed at [employer] of Stay Meetings, dates discussed:    Additional Comments:  Pollie Friar, RN 03/07/2016, 4:46 PM

## 2016-03-07 NOTE — Discharge Instructions (Signed)
You were admitted to the hospital with worsened stroke symptoms, and on the MRI of your brain it appears that you have worsening of your previous stroke. As we discussed ,you will require cerebral angiogram with angioplasty by Dr. Corena Pilgrim in one week.  Please call and schedule an appointment at [phone].   Your Prinivil (lisinopril) medication for blood pressure was not continued at discharge because your blood pressures were controlled, however please call and make an appointment to follow up with your primary care doctor within one week for a blood pressure check and hospital follow up visit.  If you find that you are having increased difficulty finding words, or if you develop any new weakness or stroke-like symptoms, please call us back or come in through the emergency department.

## 2016-03-12 ENCOUNTER — Other Ambulatory Visit (HOSPITAL_COMMUNITY): Payer: Self-pay | Admitting: Interventional Radiology

## 2016-03-12 DIAGNOSIS — I6300 Cerebral infarction due to thrombosis of unspecified precerebral artery: Secondary | ICD-10-CM

## 2016-03-13 NOTE — Discharge Summary (Signed)
[facility] Discharge Summary  Patient name: Jill Taylor Medical record number: [id number] Date of birth: 06/23/1966 Age: 50 y.o. Gender: female Date of Admission: 03/05/2016  Date of Discharge: 03/07/2016 Admitting Physician: Leeanne Rio, MD  Primary Care Provider: Imagene Riches, NP Consultants: Neurology  Indication for Hospitalization: Slurred speech and worsened expressive aphasia  Discharge Diagnoses/Problem List:  Patient Active Problem List   Diagnosis Date Noted  . Expressive dysphasia 03/06/2016  . Tobacco dependence 03/06/2016  . Peripheral artery disease ([place]) 03/06/2016  . Stroke-like symptoms   . Personal history of stroke with current residual effects   . Encephalomalacia on imaging study   . Difficulty speaking   . Stroke ([place]) 03/05/2016  . Slurred speech 03/05/2016  . 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 High cholesterol   . Brain aneurysm   . PTSD (post-traumatic stress disorder)   . Carotid stenosis      Disposition: Home  Discharge Condition: Stable  Discharge Exam:  Temp:  [97.6 F (36.4 C)-98.1 F (36.7 C)] 97.7 F (36.5 C) (08/23 0508) Pulse Rate:  [68-80] 72 (08/23 0508) Resp:  [18] 18 (08/23 0508) BP: (106-137)/(66-78) 106/68 (08/23 0508) SpO2:  [96 %-100 %] 100 % (08/23 0508) Physical Exam: General: NAD, rests comfortably Cardiovascular: RRR, no m/r/g Respiratory: CTA bil, no W/R/R Abdomen: soft, nontender, nondistended  Extremities: full ROM, no LE edema bil  Brief Hospital Course:  Patient is a 50 year old female with PMHx significant for CVA with residual expressive aphasia and LE weakness, who presented with slurred speech and worsened expressive aphasia for several days prior to admission.  The slurred speech improved during hospitalization, however expressive aphasia remained intermittent as it was described prior to admission.   She presented to the hospital outside the window for  tPA. Patient underwent CTA head and neck and was found to have no large vessel occlusion.  Patient was previously found to have high grade stenosis in L ICA terminus, origin L MCA and ACA with mild to moderat proximal R SCA and L>R VA origin stenosis, and CTA was stable on this admission from previous study in April.  MR brain was performed and demonstrated multiple acute and subacute ischemic infarcts in L MCA territory with old small L MCA territory infarcts. Neurology was involved in the care and planning for this patient, and Dr. Estanislado Pandy, who has cared for this patient in the past, followed in the hospital and planned for discharge with follow up in one week for diagnostic cerebral angiogram and likely angioplasty.  Stroke team and neurology team were on-board with this plan and patient was discharged with instructions for follow-up.     Issues for Follow Up:  1. Patient to follow up with Neurology, Dr. Estanislado Pandy 1 week after discharge for follow up angiogram and likely angioplasty. 2. Aggressive risk factor modification necessary in this patient: BP < 130/90, lipids with LDL goal <70 mg percent and diabetes A1C goal <6.5.  Continued tobacco cessatino counseling necessary.  Significant Procedures:  None  Significant Labs and Imaging:  Ct Angio Head W Or Wo Contrast Result Date: 03/05/2016 CLINICAL DATA:  50 year old female with expressive aphasia. Initial encounter. history of left middle cerebral artery symptomatic stenosis status post percutaneous. Previous right MCA bifurcation aneurysm clipping. intracranial angioplasty. Moderate to severe left ICA terminus and proximal left MCA stenosis by CTA in April this year. EXAM: CT ANGIOGRAPHY HEAD AND NECK TECHNIQUE: Multidetector CT imaging of the head and neck was performed using  the standard protocol during bolus administration of intravenous contrast. Multiplanar CT image reconstructions and MIPs were obtained to evaluate the vascular anatomy.  Carotid stenosis measurements (when applicable) are obtained utilizing NASCET criteria, using the distal internal carotid diameter as the denominator. CONTRAST:  50 mL Isovue 370. COMPARISON:  CTA head and neck 10/27/2015, and earlier. FINDINGS: CT HEAD Brain: Right middle cranial fossa aneurysm clips appear stable in configuration. Mild streak artifact. Stable mild left frontal lobe cortical encephalomalacia. Occasional scattered left greater than right white matter hypodensity appears stable. No acute intracranial hemorrhage identified. No midline shift, mass effect, or evidence of intracranial mass lesion. No cortically based acute infarct identified. Calvarium and skull base: Stable sequelae of right frontotemporal craniotomy. Stable visualized osseous structures. Paranasal sinuses: Visualized paranasal sinuses and mastoids are stable and well pneumatized. Orbits: No acute orbit or scalp soft tissue findings. CTA NECK Skeleton:  No acute osseous abnormality identified. Other neck: Negative lung apices. No superior mediastinal lymphadenopathy. Stable thyroid, larynx, pharynx, parapharyngeal spaces, retropharyngeal space, sublingual space, submandibular glands, and parotid glands. No cervical lymphadenopathy. Aortic arch: Stable soft greater than calcified arch and great vessel origin atherosclerosis. Right carotid system: Stable anterior right CCA soft plaque. Right carotid bifurcation remains patent with soft plaque in the right ICA bulb but no cervical right ICA stenosis. Left carotid system: Stable left CCA mild soft plaque in patency. Soft and calcified plaque is stable up the left ICA origin and bulb without hemodynamically significant stenosis. Vertebral arteries: Stable stenosis of the proximal right subclavian artery. Stable mild right vertebral artery origin stenosis. Negative right vertebral artery otherwise to the skullbase. Stable proximal left subclavian soft and calcified plaque without  hemodynamically significant stenosis. Stable moderate left vertebral artery origin stenosis. The left vertebral artery otherwise is negative to the skullbase. CTA HEAD Posterior circulation: Stable distal vertebral arteries without stenosis. Both PICA origins remain patent. Patent vertebrobasilar junction. Patent basilar artery without stenosis. Stable SCA and PCA origins. Posterior communicating arteries are diminutive or absent. Bilateral PCA branches are stable. Anterior circulation: Both ICA siphons remain patent, but the left is diminutive as before. There is cavernous segment calcified plaque on the right which is stable with up to moderate stenosis. The right ICA terminus appears stable and normal. The left ICA terminus is highly stenotic and diminutive as before. The left MCA and ACA origins remain highly stenotic but patent, the left A1 segment and proximal left M1 are thread-like as before. More distally appears stable since April. The left MCA Left M1 patency bifurcation remains patent. Left MCA branches are stable. More normal appearing right MCA M1 and ACA A1 segments. Anterior communicating artery remains normal. Bilateral ACA branches are stable and normal. Aneurysm clips at the right MCA trifurcation again noted with subsequent streak artifact. Visible right MCA branches are stable and within normal limits. Venous sinuses: Patent. Anatomic variants: None. Delayed phase: No abnormal enhancement identified. IMPRESSION: 1. Negative for emergent large vessel occlusion. 2. Stable CTA head and neck findings since April remarkable for - high-grade stenosis Left ICA terminus, Left MCA and ACA origins. - diminutive but patent Left MCA M1 segment. Left ACA A2 and distal branches satisfactorily supplied by the anterior communicating artery. - stable CT appearance of Right MCA bifurcation aneurysm clipping. - mild-to-moderate proximal right subclavian and left greater than right vertebral artery origin stenoses.  - age advanced soft and calcified atherosclerotic plaque in the head and neck. 3. Stable CT appearance of the brain. No acute intracranial abnormality identified.  Electronically Signed   By: Genevie Ann M.D.   On: 03/05/2016 15:41   Ct Angio Neck W Or Wo Contrast Result Date: 03/05/2016 CLINICAL DATA:  50 year old female with expressive aphasia. Initial encounter. history of left middle cerebral artery symptomatic stenosis status post percutaneous. Previous right MCA bifurcation aneurysm clipping. intracranial angioplasty. Moderate to severe left ICA terminus and proximal left MCA stenosis by CTA in April this year. EXAM: CT ANGIOGRAPHY HEAD AND NECK TECHNIQUE: Multidetector CT imaging of the head and neck was performed using the standard protocol during bolus administration of intravenous contrast. Multiplanar CT image reconstructions and MIPs were obtained to evaluate the vascular anatomy. Carotid stenosis measurements (when applicable) are obtained utilizing NASCET criteria, using the distal internal carotid diameter as the denominator. CONTRAST:  50 mL Isovue 370. COMPARISON:  CTA head and neck 10/27/2015, and earlier. FINDINGS: CT HEAD Brain: Right middle cranial fossa aneurysm clips appear stable in configuration. Mild streak artifact. Stable mild left frontal lobe cortical encephalomalacia. Occasional scattered left greater than right white matter hypodensity appears stable. No acute intracranial hemorrhage identified. No midline shift, mass effect, or evidence of intracranial mass lesion. No cortically based acute infarct identified. Calvarium and skull base: Stable sequelae of right frontotemporal craniotomy. Stable visualized osseous structures. Paranasal sinuses: Visualized paranasal sinuses and mastoids are stable and well pneumatized. Orbits: No acute orbit or scalp soft tissue findings. CTA NECK Skeleton:  No acute osseous abnormality identified. Other neck: Negative lung apices. No superior  mediastinal lymphadenopathy. Stable thyroid, larynx, pharynx, parapharyngeal spaces, retropharyngeal space, sublingual space, submandibular glands, and parotid glands. No cervical lymphadenopathy. Aortic arch: Stable soft greater than calcified arch and great vessel origin atherosclerosis. Right carotid system: Stable anterior right CCA soft plaque. Right carotid bifurcation remains patent with soft plaque in the right ICA bulb but no cervical right ICA stenosis. Left carotid system: Stable left CCA mild soft plaque in patency. Soft and calcified plaque is stable up the left ICA origin and bulb without hemodynamically significant stenosis. Vertebral arteries: Stable stenosis of the proximal right subclavian artery. Stable mild right vertebral artery origin stenosis. Negative right vertebral artery otherwise to the skullbase. Stable proximal left subclavian soft and calcified plaque without hemodynamically significant stenosis. Stable moderate left vertebral artery origin stenosis. The left vertebral artery otherwise is negative to the skullbase. CTA HEAD Posterior circulation: Stable distal vertebral arteries without stenosis. Both PICA origins remain patent. Patent vertebrobasilar junction. Patent basilar artery without stenosis. Stable SCA and PCA origins. Posterior communicating arteries are diminutive or absent. Bilateral PCA branches are stable. Anterior circulation: Both ICA siphons remain patent, but the left is diminutive as before. There is cavernous segment calcified plaque on the right which is stable with up to moderate stenosis. The right ICA terminus appears stable and normal. The left ICA terminus is highly stenotic and diminutive as before. The left MCA and ACA origins remain highly stenotic but patent, the left A1 segment and proximal left M1 are thread-like as before. More distally appears stable since April. The left MCA Left M1 patency bifurcation remains patent. Left MCA branches are stable.  More normal appearing right MCA M1 and ACA A1 segments. Anterior communicating artery remains normal. Bilateral ACA branches are stable and normal. Aneurysm clips at the right MCA trifurcation again noted with subsequent streak artifact. Visible right MCA branches are stable and within normal limits. Venous sinuses: Patent. Anatomic variants: None. Delayed phase: No abnormal enhancement identified. IMPRESSION: 1. Negative for emergent large vessel occlusion. 2. Stable CTA  head and neck findings since April remarkable for - high-grade stenosis Left ICA terminus, Left MCA and ACA origins. - diminutive but patent Left MCA M1 segment. Left ACA A2 and distal branches satisfactorily supplied by the anterior communicating artery. - stable CT appearance of Right MCA bifurcation aneurysm clipping. - mild-to-moderate proximal right subclavian and left greater than right vertebral artery origin stenoses. - age advanced soft and calcified atherosclerotic plaque in the head and neck. 3. Stable CT appearance of the brain. No acute intracranial abnormality identified. Electronically Signed   By: Genevie Ann M.D.   On: 03/05/2016 15:41   Mr Brain Wo Contrast Result Date: 03/06/2016 CLINICAL DATA:  Intermittent expressive aphasia, now worsening. Status post LEFT internal carotid artery balloon angioplasty February 09, 2015. History of aneurysm clipping, stroke, headache, hypertension, diabetes, hyperlipidemia. EXAM: MRI HEAD WITHOUT CONTRAST TECHNIQUE: Multiplanar, multiecho pulse sequences of the brain and surrounding structures were obtained without intravenous contrast. COMPARISON:  CT and CTA HEAD March 05, 2016 and MRI head Nov 17, 2014 FINDINGS: INTRACRANIAL CONTENTS: Patchy reduced diffusion LEFT frontotemporal parietal lobes. Patchy low ADC values in the larger more identifiable areas. No susceptibility artifact to suggest hemorrhage. Extensive susceptibility artifact associated with the RIGHT middle cerebral artery bifurcation  aneurysm clipping. Old bilateral basal ganglia infarcts with mild ex vacuo patient LEFT frontal horn of the lateral ventricle. Small area LEFT frontal lobe encephalomalacia. Patchy white matter T2 hyperintensities exclusive of the aforementioned abnormality. No abnormal extra-axial fluid collections. Major intracranial vascular flow voids present at skull base. ORBITS: The included ocular globes and orbital contents are non-suspicious. SINUSES: Mild paranasal sinus mucosal thickening. Mastoid air cells are well aerated. SKULL/SOFT TISSUES: No abnormal sellar expansion. No suspicious calvarial bone marrow signal. Craniocervical junction maintained. Susceptibility artifact RIGHT calvarium corresponding to craniotomy. Patient is edentulous. IMPRESSION: Multiple acute and subacute nonhemorrhagic infarcts in LEFT MCA territory. Old small LEFT MCA territory infarcts. Moderate chronic small vessel ischemic disease and old RIGHT basal ganglia lacunar infarct. Status post RIGHT craniotomy for MCA aneurysm clipping. Electronically Signed   By: Elon Alas M.D.   On: 03/06/2016 18:33    Recent Labs Lab 03/06/16 0747 03/07/16 0548  WBC 8.7 7.2  HGB 14.9 13.7  HCT 45.6 42.8  PLT 170 177    Recent Labs Lab 03/06/16 0747 03/07/16 0548  NA 138 137  K 4.4 4.1  CL 104 105  CO2 24 24  GLUCOSE 165* 190*  BUN 12 12  CREATININE 0.88 0.80  CALCIUM 9.4 8.8*    Results/Tests Pending at Time of Discharge: HbA1C  Discharge Medications:    Medication List    STOP taking these medications   lisinopril 5 MG tablet Commonly known as:  PRINIVIL,ZESTRIL   loperamide 2 MG capsule Commonly known as:  IMODIUM     TAKE these medications   ACETAMINOPHEN-BUTALBITAL 50-325 MG Tabs Take 2 tablets by mouth daily as needed (for headache or migraine).   albuterol 108 (90 Base) MCG/ACT inhaler Commonly known as:  PROVENTIL HFA;VENTOLIN HFA Inhale 2 puffs into the lungs every 6 (six) hours as needed for  wheezing or shortness of breath.   aspirin 81 MG tablet Take 81 mg by mouth daily.   atorvastatin 40 MG tablet Commonly known as:  LIPITOR Take 2 tablets (80 mg total) by mouth at bedtime. What changed:  how much to take   Black Cohosh 40 MG Caps Take 40 mg by mouth 2 (two) times daily.   BREO ELLIPTA 100-25 MCG/INH Aepb Generic  drug:  fluticasone furoate-vilanterol Inhale 2 puffs into the lungs daily.   busPIRone 10 MG tablet Commonly known as:  BUSPAR Take 10 mg by mouth 2 (two) times daily.   cetirizine 10 MG tablet Commonly known as:  ZYRTEC Take 10 mg by mouth daily.   clonazePAM 0.5 MG tablet Commonly known as:  KLONOPIN Take 1 tablet (0.5 mg total) by mouth 2 (two) times daily as needed for anxiety. What changed:  when to take this  reasons to take this   clopidogrel 75 MG tablet Commonly known as:  PLAVIX Take 75 mg by mouth daily.   clotrimazole 1 % cream Commonly known as:  LOTRIMIN Apply 1 application topically at bedtime as needed (athlete's foot).   glipiZIDE 10 MG 24 hr tablet Commonly known as:  GLUCOTROL XL Take 10 mg by mouth 2 (two) times daily.   INVOKANA 300 MG Tabs tablet Generic drug:  canagliflozin Take 300 mg by mouth daily.   ipratropium-albuterol 0.5-2.5 (3) MG/3ML Soln Commonly known as:  DUONEB Take 3 mLs by nebulization every 6 (six) hours as needed (for shortness of breath).   Magnesium 250 MG Tabs Take 250 mg by mouth 3 (three) times daily.   Melatonin 10 MG Tabs Take 10 mg by mouth at bedtime.   nortriptyline 25 MG capsule Commonly known as:  PAMELOR Take 25 mg by mouth 2 (two) times daily.   omeprazole 40 MG capsule Commonly known as:  PRILOSEC Take 40 mg by mouth 2 (two) times daily.   polyethylene glycol packet Commonly known as:  MIRALAX / GLYCOLAX Take 17 g by mouth daily as needed for moderate constipation.   pregabalin 200 MG capsule Commonly known as:  LYRICA Take 200 mg by mouth 3 (three) times daily.    QUEtiapine 25 MG tablet Commonly known as:  SEROQUEL Take 25 mg by mouth 2 (two) times daily.   traMADol 50 MG tablet Commonly known as:  ULTRAM Take 50 mg by mouth 2 (two) times daily as needed for moderate pain.   TRULICITY 1.5 0000000 Sopn Generic drug:  Dulaglutide Inject 1.5 mg into the skin once a week.       Discharge Instructions: Please refer to Patient Instructions section of EMR for full details.  Patient was counseled important signs and symptoms that should prompt return to medical care, changes in medications, dietary instructions, activity restrictions, and follow up appointments.   Follow-Up Appointments: Follow-up Information    Imagene Riches, NP. Schedule an appointment as soon as possible for a visit today.   Why:  Please call and make an appointment with your primary care provider for follow up within one week. Contact information: [address] [place] [address] [phone]        Rob Hickman, MD .   Specialty:  Interventional Radiology Why:  Please call and make an appointment at [phone] for follow up in one week. Contact information: [address] [place] [address] [phone]           Everrett Coombe, MD 03/13/2016, 1:04 AM PGY-1, [place]

## 2016-03-14 ENCOUNTER — Other Ambulatory Visit: Payer: Self-pay | Admitting: General Surgery

## 2016-03-14 ENCOUNTER — Other Ambulatory Visit: Payer: Self-pay | Admitting: Radiology

## 2016-03-15 ENCOUNTER — Other Ambulatory Visit: Payer: Self-pay | Admitting: General Surgery

## 2016-03-16 ENCOUNTER — Other Ambulatory Visit (HOSPITAL_COMMUNITY): Payer: Self-pay | Admitting: Interventional Radiology

## 2016-03-16 ENCOUNTER — Encounter (HOSPITAL_COMMUNITY): Payer: Self-pay

## 2016-03-16 ENCOUNTER — Ambulatory Visit (HOSPITAL_COMMUNITY)
Admission: RE | Admit: 2016-03-16 | Discharge: 2016-03-16 | Disposition: A | Discharge status: Home / Self Care | Payer: Medicare Other | Source: Ambulatory Visit | Attending: Interventional Radiology | Admitting: Interventional Radiology

## 2016-03-16 DIAGNOSIS — I6522 Occlusion and stenosis of left carotid artery: Secondary | ICD-10-CM | POA: Diagnosis not present

## 2016-03-16 DIAGNOSIS — F4310 Post-traumatic stress disorder, unspecified: Secondary | ICD-10-CM | POA: Insufficient documentation

## 2016-03-16 DIAGNOSIS — F17210 Nicotine dependence, cigarettes, uncomplicated: Secondary | ICD-10-CM | POA: Insufficient documentation

## 2016-03-16 DIAGNOSIS — E7800 Pure hypercholesterolemia, unspecified: Secondary | ICD-10-CM | POA: Diagnosis not present

## 2016-03-16 DIAGNOSIS — I6300 Cerebral infarction due to thrombosis of unspecified precerebral artery: Secondary | ICD-10-CM

## 2016-03-16 DIAGNOSIS — I10 Essential (primary) hypertension: Secondary | ICD-10-CM | POA: Diagnosis not present

## 2016-03-16 DIAGNOSIS — F319 Bipolar disorder, unspecified: Secondary | ICD-10-CM | POA: Diagnosis not present

## 2016-03-16 DIAGNOSIS — E119 Type 2 diabetes mellitus without complications: Secondary | ICD-10-CM | POA: Diagnosis not present

## 2016-03-16 DIAGNOSIS — I6602 Occlusion and stenosis of left middle cerebral artery: Secondary | ICD-10-CM | POA: Diagnosis not present

## 2016-03-16 DIAGNOSIS — K219 Gastro-esophageal reflux disease without esophagitis: Secondary | ICD-10-CM | POA: Diagnosis not present

## 2016-03-16 DIAGNOSIS — J449 Chronic obstructive pulmonary disease, unspecified: Secondary | ICD-10-CM | POA: Insufficient documentation

## 2016-03-16 DIAGNOSIS — I6501 Occlusion and stenosis of right vertebral artery: Secondary | ICD-10-CM | POA: Insufficient documentation

## 2016-03-16 DIAGNOSIS — Z7902 Long term (current) use of antithrombotics/antiplatelets: Secondary | ICD-10-CM | POA: Insufficient documentation

## 2016-03-16 DIAGNOSIS — Z8673 Personal history of transient ischemic attack (TIA), and cerebral infarction without residual deficits: Secondary | ICD-10-CM | POA: Diagnosis not present

## 2016-03-16 DIAGNOSIS — Z882 Allergy status to sulfonamides status: Secondary | ICD-10-CM | POA: Insufficient documentation

## 2016-03-16 DIAGNOSIS — R51 Headache: Secondary | ICD-10-CM | POA: Diagnosis present

## 2016-03-16 DIAGNOSIS — K589 Irritable bowel syndrome without diarrhea: Secondary | ICD-10-CM | POA: Diagnosis not present

## 2016-03-16 HISTORY — PX: IR GENERIC HISTORICAL: IMG1180011

## 2016-03-16 LAB — CBC
HEMATOCRIT: 44.7 % (ref 36.0–46.0)
Hemoglobin: 14.2 g/dL (ref 12.0–15.0)
MCH: 28.1 pg (ref 26.0–34.0)
MCHC: 31.8 g/dL (ref 30.0–36.0)
MCV: 88.3 fL (ref 78.0–100.0)
Platelets: 175 10*3/uL (ref 150–400)
RBC: 5.06 MIL/uL (ref 3.87–5.11)
RDW: 15.6 % — ABNORMAL HIGH (ref 11.5–15.5)
WBC: 9 10*3/uL (ref 4.0–10.5)

## 2016-03-16 LAB — BASIC METABOLIC PANEL
Anion gap: 8 (ref 5–15)
BUN: 13 mg/dL (ref 6–20)
CHLORIDE: 102 mmol/L (ref 101–111)
CO2: 26 mmol/L (ref 22–32)
Calcium: 9.4 mg/dL (ref 8.9–10.3)
Creatinine, Ser: 0.82 mg/dL (ref 0.44–1.00)
GFR calc non Af Amer: >60 mL/min (ref >60)
Glucose, Bld: 219 mg/dL — ABNORMAL HIGH (ref 65–99)
POTASSIUM: 4.4 mmol/L (ref 3.5–5.1)
Sodium: 136 mmol/L (ref 135–145)

## 2016-03-16 LAB — PROTIME-INR
INR: 0.92
Prothrombin Time: 12.4 seconds (ref 11.4–15.2)

## 2016-03-16 LAB — GLUCOSE, CAPILLARY: GLUCOSE-CAPILLARY: 221 mg/dL — AB (ref 65–99)

## 2016-03-16 LAB — APTT: aPTT: 20 seconds — ABNORMAL LOW (ref 24–36)

## 2016-03-16 MED ORDER — HEPARIN SODIUM (PORCINE) 1000 UNIT/ML IJ SOLN
INTRAMUSCULAR | Status: AC
  Filled 2016-03-16: qty 1

## 2016-03-16 MED ORDER — FENTANYL CITRATE (PF) 100 MCG/2ML IJ SOLN
INTRAMUSCULAR | Status: AC | PRN
  Administered 2016-03-16: 25 ug via INTRAVENOUS

## 2016-03-16 MED ORDER — IOPAMIDOL (ISOVUE-300) INJECTION 61%
INTRAVENOUS | Status: AC
  Administered 2016-03-16: 70 mL
  Filled 2016-03-16: qty 150

## 2016-03-16 MED ORDER — HEPARIN SODIUM (PORCINE) 1000 UNIT/ML IJ SOLN
INTRAMUSCULAR | Status: AC | PRN
  Administered 2016-03-16: 1000 [IU] via INTRAVENOUS

## 2016-03-16 MED ORDER — LIDOCAINE HCL 1 % IJ SOLN
INTRAMUSCULAR | Status: AC
  Administered 2016-03-16: 10 mL
  Filled 2016-03-16: qty 20

## 2016-03-16 MED ORDER — SODIUM CHLORIDE 0.9 % IV SOLN: INTRAVENOUS | Status: AC

## 2016-03-16 MED ORDER — SODIUM CHLORIDE 0.9 % IV SOLN: Freq: Once | INTRAVENOUS | Status: DC

## 2016-03-16 MED ORDER — MIDAZOLAM HCL 2 MG/2ML IJ SOLN
INTRAMUSCULAR | Status: AC
  Filled 2016-03-16: qty 2

## 2016-03-16 MED ORDER — FENTANYL CITRATE (PF) 100 MCG/2ML IJ SOLN
INTRAMUSCULAR | Status: AC
  Filled 2016-03-16: qty 2

## 2016-03-16 MED ORDER — MIDAZOLAM HCL 2 MG/2ML IJ SOLN
INTRAMUSCULAR | Status: AC | PRN
  Administered 2016-03-16: 1 mg via INTRAVENOUS

## 2016-03-16 MED ORDER — IOPAMIDOL (ISOVUE-300) INJECTION 61%
INTRAVENOUS | Status: AC
  Filled 2016-03-16: qty 50

## 2016-03-16 NOTE — Sedation Documentation (Signed)
Pt will recover 3 hrs in SStay- until 1230-dsg R groin intact- instructions given to pt and family

## 2016-03-16 NOTE — Sedation Documentation (Signed)
Patient is resting comfortably. 

## 2016-03-16 NOTE — Discharge Instructions (Signed)
Angiogram, Care After °Refer to this sheet in the next few weeks. These instructions provide you with information about caring for yourself after your procedure. Your health care provider may also give you more specific instructions. Your treatment has been planned according to current medical practices, but problems sometimes occur. Call your health care provider if you have any problems or questions after your procedure. °WHAT TO EXPECT AFTER THE PROCEDURE °After your procedure, it is typical to have the following: °· Bruising at the catheter insertion site that usually fades within 1-2 weeks. °· Blood collecting in the tissue (hematoma) that may be painful to the touch. It should usually decrease in size and tenderness within 1-2 weeks. °HOME CARE INSTRUCTIONS °· Take medicines only as directed by your health care provider. °· You may shower 24-48 hours after the procedure or as directed by your health care provider. Remove the bandage (dressing) and gently wash the site with plain soap and water. Pat the area dry with a clean towel. Do not rub the site, because this may cause bleeding. °· Do not take baths, swim, or use a hot tub until your health care provider approves. °· Check your insertion site every day for redness, swelling, or drainage. °· Do not apply powder or lotion to the site. °· Do not lift over 10 lb (4.5 kg) for 5 days after your procedure or as directed by your health care provider. °· Ask your health care provider when it is okay to: °¨ Return to work or school. °¨ Resume usual physical activities or sports. °¨ Resume sexual activity. °· Do not drive home if you are discharged the same day as the procedure. Have someone else drive you. °· You may drive 24 hours after the procedure unless otherwise instructed by your health care provider. °· Do not operate machinery or power tools for 24 hours after the procedure or as directed by your health care provider. °· If your procedure was done as an  outpatient procedure, which means that you went home the same day as your procedure, a responsible adult should be with you for the first 24 hours after you arrive home. °· Keep all follow-up visits as directed by your health care provider. This is important. °SEEK MEDICAL CARE IF: °· You have a fever. °· You have chills. °· You have increased bleeding from the catheter insertion site. Hold pressure on the site. °SEEK IMMEDIATE MEDICAL CARE IF: °· You have unusual pain at the catheter insertion site. °· You have redness, warmth, or swelling at the catheter insertion site. °· You have drainage (other than a small amount of blood on the dressing) from the catheter insertion site. °· The catheter insertion site is bleeding, and the bleeding does not stop after 30 minutes of holding steady pressure on the site. °· The area near or just beyond the catheter insertion site becomes pale, cool, tingly, or numb. °  °This information is not intended to replace advice given to you by your health care provider. Make sure you discuss any questions you have with your health care provider. °  °Document Released: 01/18/2005 Document Revised: 07/23/2014 Document Reviewed: 12/03/2012 °Elsevier Interactive Patient Education ©2016 [employer] ° °

## 2016-03-16 NOTE — Sedation Documentation (Signed)
Pressure being held to R groin.

## 2016-03-16 NOTE — Sedation Documentation (Signed)
NS 150cc bolus per MD infusing

## 2016-03-16 NOTE — Sedation Documentation (Signed)
IR tech placing exoseal and will hold pressure

## 2016-03-16 NOTE — H&P (Signed)
Chief Complaint: L MCA stenosis  Referring Physician:Dr. Elson Clan  Supervising Physician: Luanne Bras  Patient Status:  Out-pt  HPI: Jill Taylor is an 50 y.o. 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  She developed restenosis after the angioplasty.  She was just admitted about a week and a half ago with new onset expressive aphasia and new CVAs.  She is on plavix.  She is continuing to have headaches.  She is brought in today for a follow up angiogram to determine if further treatment, such as stenting is warranted.  Past Medical History:  Past Medical History:  Diagnosis Date  . Anxiety   . Bipolar disorder ([place])   . Brain aneurysm   . COPD (chronic obstructive pulmonary disease) ([place])   . Depression   . Diabetes mellitus without complication ([place])   . Dysrhythmia   . GERD (gastroesophageal reflux disease)   . Headache   . High cholesterol   . Hypertension   . IBS (irritable bowel syndrome)   . Pneumonia 2013 ish  . PTSD (post-traumatic stress disorder)   . Shortness of breath dyspnea    with exertion  . Stroke [facility]) 05/2014   05/2014-TIA, TIA and CVA- 7 since 05/2014- memory loss and expressive aphasia. Left sided weakness  . UTI (lower urinary tract infection)     Past Surgical History:  Past Surgical History:  Procedure Laterality Date  . ABDOMINAL HYSTERECTOMY    . BRAIN SURGERY  2007  . BRAIN SURGERY    . CHOLECYSTECTOMY    . RADIOLOGY WITH ANESTHESIA N/A 02/09/2015   Procedure: ANGIOPLASTY;  Surgeon: Luanne Bras, MD;  Location: [place];  Service: Radiology;  Laterality: N/A;  . TUBAL LIGATION      Family History: History reviewed. No pertinent family history.  Social History:  reports that she has been smoking Cigarettes.  She has never used smokeless tobacco. She reports that she does not drink alcohol or use  drugs.  Allergies:  Allergies  Allergen Reactions  . Other Shortness Of Breath    Allergy to pine trees, hickory trees and mulberry trees & 2 types of fungus   . Sulfa Antibiotics Hives    Medications: Medications reviewed  Please HPI for pertinent positives, otherwise complete 10 system ROS negative.  Mallampati Score: MD Evaluation Airway: WNL Heart: WNL Abdomen: WNL Chest/ Lungs: WNL ASA  Classification: 3 Mallampati/Airway Score: Two  Physical Exam: BP 125/72   Pulse 71   Temp 97.9 F (36.6 C) (Oral)   Resp 16   Ht 5' 1"  (1.549 m)   Wt 170 lb (77.1 kg)   SpO2 91%   BMI 32.12 kg/m  Body mass index is 32.12 kg/m. General: pleasant, WD, WN white female who is laying in bed in NAD HEENT: head is normocephalic, atraumatic.  Sclera are noninjected.  PERRL.  Ears and nose without any masses or lesions.  Mouth is pink and moist Heart: regular, rate, and rhythm.  Normal s1,s2. No obvious murmurs, gallops, or rubs noted.  Palpable radial and pedal pulses bilaterally Lungs: CTAB, no wheezes, rhonchi, or rales noted.  Respiratory effort nonlabored Abd: soft, NT, ND, +BS, no masses, hernias, or organomegaly MS: all 4 extremities are symmetrical with no cyanosis, clubbing, or edema. Psych: A&Ox3 with an appropriate affect.   Labs: Results for orders placed or performed during the hospital encounter  of 03/16/16 (from the past 48 hour(s))  Basic metabolic panel     Status: Abnormal   Collection Time: 03/16/16  6:45 AM  Result Value Ref Range   Sodium 136 135 - 145 mmol/L   Potassium 4.4 3.5 - 5.1 mmol/L   Chloride 102 101 - 111 mmol/L   CO2 26 22 - 32 mmol/L   Glucose, Bld 219 (H) 65 - 99 mg/dL   BUN 13 6 - 20 mg/dL   Creatinine, Ser 0.82 0.44 - 1.00 mg/dL   Calcium 9.4 8.9 - 10.3 mg/dL   GFR calc non Af Amer >60 >60 mL/min   GFR calc Af Amer >60 >60 mL/min    Comment: (NOTE) The eGFR has been calculated using the CKD EPI equation. This calculation has not been  validated in all clinical situations. eGFR's persistently <60 mL/min signify possible Chronic Kidney Disease.    Anion gap 8 5 - 15  CBC     Status: Abnormal   Collection Time: 03/16/16  6:45 AM  Result Value Ref Range   WBC 9.0 4.0 - 10.5 K/uL   RBC 5.06 3.87 - 5.11 MIL/uL   Hemoglobin 14.2 12.0 - 15.0 g/dL   HCT 44.7 36.0 - 46.0 %   MCV 88.3 78.0 - 100.0 fL   MCH 28.1 26.0 - 34.0 pg   MCHC 31.8 30.0 - 36.0 g/dL   RDW 15.6 (H) 11.5 - 15.5 %   Platelets 175 150 - 400 K/uL  Protime-INR     Status: None   Collection Time: 03/16/16  6:45 AM  Result Value Ref Range   Prothrombin Time 12.4 11.4 - 15.2 seconds   INR 0.92   Glucose, capillary     Status: Abnormal   Collection Time: 03/16/16  7:15 AM  Result Value Ref Range   Glucose-Capillary 221 (H) 65 - 99 mg/dL    Imaging: No results found.  Assessment/Plan 1. L MCA stenosis -we will plan to proceed today with a diagnostic angiogram to further evaluate this area of restenosis.  If this area is concerning, then she may need treatment, such as stenting, set up. -labs have been reviewed -Risks and Benefits discussed with the patient including, but not limited to bleeding, infection, vascular injury or contrast induced renal failure. All of the patient's questions were answered, patient is agreeable to proceed. Consent signed and in chart.   Thank you for this interesting consult.  I greatly enjoyed meeting Floetta Brickey and look forward to participating in their care.  A copy of this report was sent to the requesting provider on this date.  Electronically Signed: Henreitta Cea 03/16/2016, 8:06 AM   I spent a total of    25 Minutes in face to face in clinical consultation, greater than 50% of which was counseling/coordinating care for L MCA stenosis

## 2016-03-16 NOTE — Procedures (Signed)
S/P bilateral common carotid arteriogram,and RT VA angiogram. RT CFA approach. Findings. 1.Approx 75 to 80 % stenosis of LT MCA prox. 2.Approx 50 to 60 % stenosis of RT VA origin

## 2016-03-21 ENCOUNTER — Encounter (HOSPITAL_COMMUNITY): Payer: Self-pay | Admitting: Interventional Radiology

## 2016-03-22 ENCOUNTER — Other Ambulatory Visit (HOSPITAL_COMMUNITY): Payer: Self-pay | Admitting: Interventional Radiology

## 2016-03-22 DIAGNOSIS — I6602 Occlusion and stenosis of left middle cerebral artery: Secondary | ICD-10-CM

## 2016-03-22 DIAGNOSIS — I771 Stricture of artery: Secondary | ICD-10-CM

## 2016-03-23 ENCOUNTER — Other Ambulatory Visit: Payer: Self-pay | Admitting: Radiology

## 2016-03-23 ENCOUNTER — Telehealth (HOSPITAL_COMMUNITY): Payer: Self-pay | Admitting: Radiology

## 2016-03-23 LAB — PLATELET INHIBITION P2Y12: Platelet Function  P2Y12: 181 [PRU] — ABNORMAL LOW (ref 194–418)

## 2016-03-23 NOTE — Telephone Encounter (Signed)
Called pt, left VM. Deveshwar requests that she change her medication regimen. She is to start Aspirin 325mg  1 daily on 03/24/16 and change her Plavix to 75mg  1 tablet in the morning and 1/2 tablet (37.5mg ) in the evening. JM

## 2016-03-26 ENCOUNTER — Telehealth (HOSPITAL_COMMUNITY): Payer: Self-pay | Admitting: Radiology

## 2016-03-26 NOTE — Telephone Encounter (Signed)
Pt returned my phone call to verify that she had the correct medication dosage. She does. JM

## 2016-03-27 ENCOUNTER — Other Ambulatory Visit: Payer: Self-pay | Admitting: General Surgery

## 2016-03-27 ENCOUNTER — Encounter (HOSPITAL_COMMUNITY): Payer: Self-pay | Admitting: *Deleted

## 2016-03-27 ENCOUNTER — Encounter (HOSPITAL_COMMUNITY): Payer: Self-pay | Admitting: Certified Registered Nurse Anesthetist

## 2016-03-27 ENCOUNTER — Other Ambulatory Visit: Payer: Self-pay | Admitting: Radiology

## 2016-03-27 NOTE — Progress Notes (Signed)
I instructed patient to check CBG to check CBG and if it is less than 70 to treat it with Glucose Gel, Glucose tablets or 1/2 cup of clear juice like apple juice or cranberry juice, or 1/2 cup of regular soda. (not cream soda). I instructed patient to recheck CBG in 15 minutes and if CBG is not greater than 70, to  Call [phone] (pre- op). If it is before pre-op opens to retreat as before and recheck CBG in 15 minutes. I told patient to make note of time that liquid is taken and amount, that surgical time may have to be adjusted.  

## 2016-03-28 ENCOUNTER — Encounter (HOSPITAL_COMMUNITY): Payer: Self-pay | Admitting: General Surgery

## 2016-03-28 ENCOUNTER — Encounter (HOSPITAL_COMMUNITY)
Admission: RE | Disposition: A | Discharge status: Home / Self Care | Payer: Self-pay | Source: Ambulatory Visit | Attending: Interventional Radiology

## 2016-03-28 ENCOUNTER — Ambulatory Visit (HOSPITAL_COMMUNITY)
Admission: RE | Admit: 2016-03-28 | Discharge: 2016-03-28 | Disposition: A | Discharge status: Home / Self Care | Payer: Medicare Other | Source: Ambulatory Visit | Attending: Interventional Radiology | Admitting: Interventional Radiology

## 2016-03-28 ENCOUNTER — Encounter (HOSPITAL_COMMUNITY): Payer: Self-pay

## 2016-03-28 ENCOUNTER — Ambulatory Visit (HOSPITAL_COMMUNITY): Admission: RE | Admit: 2016-03-28 | Payer: Medicare Other | Source: Ambulatory Visit

## 2016-03-28 DIAGNOSIS — Z7982 Long term (current) use of aspirin: Secondary | ICD-10-CM | POA: Insufficient documentation

## 2016-03-28 DIAGNOSIS — J449 Chronic obstructive pulmonary disease, unspecified: Secondary | ICD-10-CM | POA: Diagnosis not present

## 2016-03-28 DIAGNOSIS — E1140 Type 2 diabetes mellitus with diabetic neuropathy, unspecified: Secondary | ICD-10-CM | POA: Insufficient documentation

## 2016-03-28 DIAGNOSIS — I6602 Occlusion and stenosis of left middle cerebral artery: Secondary | ICD-10-CM | POA: Insufficient documentation

## 2016-03-28 DIAGNOSIS — F419 Anxiety disorder, unspecified: Secondary | ICD-10-CM | POA: Diagnosis not present

## 2016-03-28 DIAGNOSIS — E7800 Pure hypercholesterolemia, unspecified: Secondary | ICD-10-CM | POA: Insufficient documentation

## 2016-03-28 DIAGNOSIS — K219 Gastro-esophageal reflux disease without esophagitis: Secondary | ICD-10-CM | POA: Insufficient documentation

## 2016-03-28 DIAGNOSIS — Z7902 Long term (current) use of antithrombotics/antiplatelets: Secondary | ICD-10-CM | POA: Diagnosis not present

## 2016-03-28 DIAGNOSIS — Z79899 Other long term (current) drug therapy: Secondary | ICD-10-CM | POA: Insufficient documentation

## 2016-03-28 DIAGNOSIS — F319 Bipolar disorder, unspecified: Secondary | ICD-10-CM | POA: Diagnosis not present

## 2016-03-28 DIAGNOSIS — Z7984 Long term (current) use of oral hypoglycemic drugs: Secondary | ICD-10-CM | POA: Diagnosis not present

## 2016-03-28 DIAGNOSIS — F17210 Nicotine dependence, cigarettes, uncomplicated: Secondary | ICD-10-CM | POA: Insufficient documentation

## 2016-03-28 DIAGNOSIS — M797 Fibromyalgia: Secondary | ICD-10-CM | POA: Diagnosis not present

## 2016-03-28 DIAGNOSIS — Z5309 Procedure and treatment not carried out because of other contraindication: Secondary | ICD-10-CM | POA: Diagnosis not present

## 2016-03-28 DIAGNOSIS — Z8673 Personal history of transient ischemic attack (TIA), and cerebral infarction without residual deficits: Secondary | ICD-10-CM | POA: Insufficient documentation

## 2016-03-28 HISTORY — DX: Polyneuropathy, unspecified: G62.9

## 2016-03-28 HISTORY — DX: Fibromyalgia: M79.7

## 2016-03-28 LAB — APTT: aPTT: 24 seconds (ref 24–36)

## 2016-03-28 LAB — CBC WITH DIFFERENTIAL/PLATELET
Basophils Absolute: 0.1 10*3/uL (ref 0.0–0.1)
Basophils Relative: 1 %
EOS PCT: 3 %
Eosinophils Absolute: 0.2 10*3/uL (ref 0.0–0.7)
HCT: 42.5 % (ref 36.0–46.0)
Hemoglobin: 13.5 g/dL (ref 12.0–15.0)
LYMPHS ABS: 2.3 10*3/uL (ref 0.7–4.0)
LYMPHS PCT: 27 %
MCH: 28.4 pg (ref 26.0–34.0)
MCHC: 31.8 g/dL (ref 30.0–36.0)
MCV: 89.3 fL (ref 78.0–100.0)
MONO ABS: 0.6 10*3/uL (ref 0.1–1.0)
Monocytes Relative: 7 %
Neutro Abs: 5.3 10*3/uL (ref 1.7–7.7)
Neutrophils Relative %: 62 %
PLATELETS: 152 10*3/uL (ref 150–400)
RBC: 4.76 MIL/uL (ref 3.87–5.11)
RDW: 15.4 % (ref 11.5–15.5)
WBC: 8.5 10*3/uL (ref 4.0–10.5)

## 2016-03-28 LAB — PROTIME-INR
INR: 1
Prothrombin Time: 13.1 seconds (ref 11.4–15.2)

## 2016-03-28 LAB — BASIC METABOLIC PANEL
Anion gap: 9 (ref 5–15)
BUN: 10 mg/dL (ref 6–20)
CHLORIDE: 103 mmol/L (ref 101–111)
CO2: 25 mmol/L (ref 22–32)
Calcium: 9.2 mg/dL (ref 8.9–10.3)
Creatinine, Ser: 0.89 mg/dL (ref 0.44–1.00)
GFR calc Af Amer: >60 mL/min (ref >60)
GFR calc non Af Amer: >60 mL/min (ref >60)
GLUCOSE: 204 mg/dL — AB (ref 65–99)
POTASSIUM: 4.5 mmol/L (ref 3.5–5.1)
Sodium: 137 mmol/L (ref 135–145)

## 2016-03-28 LAB — GLUCOSE, CAPILLARY: GLUCOSE-CAPILLARY: 201 mg/dL — AB (ref 65–99)

## 2016-03-28 LAB — PLATELET INHIBITION P2Y12: Platelet Function  P2Y12: 186 [PRU] — ABNORMAL LOW (ref 194–418)

## 2016-03-28 SURGERY — RADIOLOGY WITH ANESTHESIA
Anesthesia: General

## 2016-03-28 MED ORDER — ALBUTEROL SULFATE (2.5 MG/3ML) 0.083% IN NEBU: 2.5000 mg | INHALATION_SOLUTION | Freq: Once | RESPIRATORY_TRACT | Status: DC

## 2016-03-28 NOTE — Anesthesia Preprocedure Evaluation (Deleted)
Anesthesia Evaluation  Patient identified by MRN, date of birth, ID band Patient awake    Reviewed: Allergy & Precautions, NPO status , Patient's Chart, lab work & pertinent test results  Airway Mallampati: I  TM Distance: >3 FB Neck ROM: Full    Dental  (+) Edentulous Upper, Poor Dentition   Pulmonary COPD, Current Smoker,    breath sounds clear to auscultation       Cardiovascular + Peripheral Vascular Disease  + dysrhythmias  Rhythm:Regular Rate:Normal     Neuro/Psych  Headaches, PSYCHIATRIC DISORDERS Anxiety Cerebral aneurysm CVA, Residual Symptoms    GI/Hepatic Neg liver ROS, GERD  Medicated,  Endo/Other  diabetes, Oral Hypoglycemic Agents  Renal/GU negative Renal ROS     Musculoskeletal   Abdominal   Peds  Hematology negative hematology ROS (+)   Anesthesia Other Findings   Reproductive/Obstetrics                             Anesthesia Physical  Anesthesia Plan  ASA: III  Anesthesia Plan: General   Post-op Pain Management:    Induction: Intravenous  Airway Management Planned: Oral ETT  Additional Equipment: Arterial line  Intra-op Plan:   Post-operative Plan: Extubation in OR  Informed Consent: I have reviewed the patients History and Physical, chart, labs and discussed the procedure including the risks, benefits and alternatives for the proposed anesthesia with the patient or authorized representative who has indicated his/her understanding and acceptance.   Dental advisory given  Plan Discussed with: CRNA and Surgeon  Anesthesia Plan Comments:         Anesthesia Quick Evaluation

## 2016-03-28 NOTE — Progress Notes (Signed)
Procedure was cancelled today as her P2Y12 was 189.  Dr. Estanislado Pandy has spoken to the patient.  She will increase her plavix to BID.  She will return later this week or early next week for a P2Y12 and then be rescheduled based on these results.  The patient and her husband understand and agree to this plan.  Antaeus Karel E 03/28/2016

## 2016-03-28 NOTE — H&P (Signed)
Chief Complaint: (L) MCA stenosis  Referring Physician:Dr. Elson Clan  Supervising Physician: Luanne Bras  Patient Status:  Out-pt  HPI: Jill Taylor is an 50 y.o. female well-known to our service for recent CVAs.  She had a dx angiogram done recently that revealed stenosis of her (L) MCA.  She continues to have some headaches and intermittent dysarthria.  She is taking her plavix and her asa as instructed.  She has no changes in her history since we saw her a week ago.  She has no new complaints.  She presents today for intervention for her stenosis.  Past Medical History:  Past Medical History:  Diagnosis Date  . Anxiety   . Bipolar disorder ([place])   . Brain aneurysm   . COPD (chronic obstructive pulmonary disease) ([place])   . Depression   . Diabetes mellitus without complication ([place])    [place] II  . Dysrhythmia   . Fibromyalgia   . GERD (gastroesophageal reflux disease)   . Headache   . High cholesterol   . Hypertension    "not anymore"  . IBS (irritable bowel syndrome)   . Neuropathy ([place])   . Pneumonia 2013 ish  . PTSD (post-traumatic stress disorder)   . Shortness of breath dyspnea    with exertion  . Stroke [facility]) 05/2014   05/2014-TIA, TIA and CVA- 7 since 05/2014- memory loss and expressive aphasia. Left sided weakness. "2 in Auguat 2017- hospitalizized  Aug 21- Aug 30, @017  "they said I have weakness on left side, I dont see it."  . UTI (lower urinary tract infection)     Past Surgical History:  Past Surgical History:  Procedure Laterality Date  . ABDOMINAL HYSTERECTOMY    . BRAIN SURGERY  2007  . BRAIN SURGERY    . CHOLECYSTECTOMY    . IR GENERIC HISTORICAL  03/16/2016   IR ANGIO INTRA EXTRACRAN SEL COM CAROTID INNOMINATE BILAT MOD SED 03/16/2016 Luanne Bras, MD [facility]INTER[facility] RAD  . IR GENERIC HISTORICAL  03/16/2016   IR ANGIO VERTEBRAL SEL VERTEBRAL UNI R MOD SED 03/16/2016 Luanne Bras, MD [facility]  . RADIOLOGY WITH ANESTHESIA N/A  02/09/2015   Procedure: ANGIOPLASTY;  Surgeon: Luanne Bras, MD;  Location: [facility];  Service: Radiology;  Laterality: N/A;  . TUBAL LIGATION      Family History: History reviewed. No pertinent family history.  Social History:  reports that she has been smoking Cigarettes and E-cigarettes.  She has a 26.25 pack-year smoking history. She has never used smokeless tobacco. She reports that she does not drink alcohol or use drugs.  Allergies:  Allergies  Allergen Reactions  . Other Shortness Of Breath    Allergy to pine trees, hickory trees and mulberry trees & 2 types of fungus   . Sulfa Antibiotics Hives    Medications: Medications reviewed in Epic  Please HPI for pertinent positives, otherwise complete 10 system ROS negative.  Mallampati Score: MD Evaluation Airway: WNL Heart: WNL Abdomen: WNL Chest/ Lungs: WNL ASA  Classification: 3  Physical Exam: BP 124/81   Pulse 67   Temp 97.9 F (36.6 C) (Oral)   Resp 18   Wt 170 lb (77.1 kg)   SpO2 96%   BMI 32.12 kg/m  Body mass index is 32.12 kg/m. General: pleasant, WD, WN white female who is laying in bed in NAD HEENT: head is normocephalic, atraumatic.  Sclera are noninjected.  PERRL.  Ears and nose without any masses or lesions.  Mouth is pink and moist  Heart: regular, rate, and rhythm.  Normal s1,s2. No obvious murmurs, gallops, or rubs noted.  Palpable radial and pedal pulses bilaterally Lungs: CTAB, no wheezes, rhonchi, or rales noted.  Respiratory effort nonlabored Abd: soft, NT, ND, +BS, no masses, hernias, or organomegaly MS: all 4 extremities are symmetrical with no cyanosis, clubbing, or edema. Neuro: NVI, slight decrease in grip strength of left hand as opposed to right, otherwise strength is equal throughout.  Normal finger-nose-finger test. Psych: A&Ox3 with an appropriate affect.   Labs: Results for orders placed or performed during the hospital encounter of 03/28/16 (from the past 48 hour(s))  Glucose,  capillary     Status: Abnormal   Collection Time: 03/28/16  6:52 AM  Result Value Ref Range   Glucose-Capillary 201 (H) 65 - 99 mg/dL   Comment 1 Notify RN    Comment 2 Document in Chart   CBC WITH DIFFERENTIAL     Status: None   Collection Time: 03/28/16  7:08 AM  Result Value Ref Range   WBC 8.5 4.0 - 10.5 K/uL   RBC 4.76 3.87 - 5.11 MIL/uL   Hemoglobin 13.5 12.0 - 15.0 g/dL   HCT 42.5 36.0 - 46.0 %   MCV 89.3 78.0 - 100.0 fL   MCH 28.4 26.0 - 34.0 pg   MCHC 31.8 30.0 - 36.0 g/dL   RDW 15.4 11.5 - 15.5 %   Platelets 152 150 - 400 K/uL   Neutrophils Relative % 62 %   Neutro Abs 5.3 1.7 - 7.7 K/uL   Lymphocytes Relative 27 %   Lymphs Abs 2.3 0.7 - 4.0 K/uL   Monocytes Relative 7 %   Monocytes Absolute 0.6 0.1 - 1.0 K/uL   Eosinophils Relative 3 %   Eosinophils Absolute 0.2 0.0 - 0.7 K/uL   Basophils Relative 1 %   Basophils Absolute 0.1 0.0 - 0.1 K/uL    Imaging: No results found.  Assessment/Plan 1. (L) MCA stenosis -we will plan for intervention today with angioplasty and stenting.  Awaiting P2Y12 to make sure we can proceed today. -patient has had intervention before and is aware of the procedure and what it entails.  She will be admitted overnight pending intervention with plans for DC home in the AM if doing well. -Risks and Benefits discussed with the patient including, but not limited to bleeding, infection, vascular injury or contrast induced renal failure. All of the patient's questions were answered, patient is agreeable to proceed. Consent signed and in chart.  Thank you for this interesting consult.  I greatly enjoyed meeting Jill Taylor and look forward to participating in their care.  A copy of this report was sent to the requesting provider on this date.  Electronically Signed: Henreitta Cea 03/28/2016, 7:40 AM   I spent a total of    25 Minutes in face to face in clinical consultation, greater than 50% of which was counseling/coordinating care for  (L) MCA stenosis

## 2016-03-28 NOTE — Progress Notes (Signed)
Patient's procedure was cancelled today due to labs.  Patient d/cd home with family.

## 2016-04-02 ENCOUNTER — Telehealth (HOSPITAL_COMMUNITY): Payer: Self-pay | Admitting: *Deleted

## 2016-04-02 ENCOUNTER — Other Ambulatory Visit: Payer: Self-pay | Admitting: Radiology

## 2016-04-02 LAB — PLATELET INHIBITION P2Y12: Platelet Function  P2Y12: 172 [PRU] — ABNORMAL LOW (ref 194–418)

## 2016-04-02 NOTE — Telephone Encounter (Signed)
Called and left instructions with patient that Dr. Estanislado Pandy wants to change medication from Plavix to Brilinta, he also is changing  ASA from 325mg  to 81mg  daily.  Dose for Brilinta should be 90mg  in amd 45mg  in pm.

## 2016-04-02 NOTE — Telephone Encounter (Signed)
Called [facility] d/c'd plavix and called in Brilinta per Dr. Estanislado Pandy instruction Brilinta 65mhg in am and  45mg  in pm.  45tabs refill x3

## 2016-04-10 ENCOUNTER — Telehealth (HOSPITAL_COMMUNITY): Payer: Self-pay | Admitting: Radiology

## 2016-04-10 ENCOUNTER — Other Ambulatory Visit: Payer: Self-pay | Admitting: Radiology

## 2016-04-10 LAB — PLATELET INHIBITION P2Y12: PLATELET FUNCTION P2Y12: 6 [PRU] — AB (ref 194–418)

## 2016-04-10 NOTE — Telephone Encounter (Signed)
Called pt, left VM for her to come in as soon as she can to have her P2Y12 redrawn. She has been on the Brilinta now for 1 week and needs this rechecked so she can be rescheduled for her intervention with Deveshwar. JM

## 2016-04-11 ENCOUNTER — Telehealth (HOSPITAL_COMMUNITY): Payer: Self-pay | Admitting: *Deleted

## 2016-04-11 NOTE — Telephone Encounter (Signed)
Called and spoke with patient.  Per Dr. Estanislado Pandy patient is to take Brilinta 45mg  BID and continue taking ASA 81 mg daily.  Pt to come next for a repeat P2Y12

## 2016-04-19 ENCOUNTER — Other Ambulatory Visit: Payer: Self-pay | Admitting: Radiology

## 2016-04-19 LAB — PLATELET INHIBITION P2Y12: PLATELET FUNCTION P2Y12: 37 [PRU] — AB (ref 194–418)

## 2016-04-24 ENCOUNTER — Telehealth (HOSPITAL_COMMUNITY): Payer: Self-pay

## 2016-04-24 ENCOUNTER — Telehealth (HOSPITAL_COMMUNITY): Payer: Self-pay | Admitting: Radiology

## 2016-04-24 NOTE — Telephone Encounter (Signed)
Called pt to discuss scheduling her procedure with Deveshwar. Pt has decided to proceed with procedure on 05/07/16 JM

## 2016-04-25 NOTE — Telephone Encounter (Signed)
spoke with pt regarding P2Y12 from 10/5. per dr. Estanislado Pandy pt to continue taking Brillinta 45mg  BID and ASA 81mg  every day. Anderson Malta will call back today to schedule next appointment. Pt agrees with plan.    By Fredric Dine, RN

## 2016-05-07 ENCOUNTER — Encounter: Payer: Self-pay | Admitting: Neurology

## 2016-05-07 ENCOUNTER — Ambulatory Visit (HOSPITAL_COMMUNITY): Admission: RE | Admit: 2016-05-07 | Payer: Medicare Other | Source: Ambulatory Visit

## 2016-05-07 ENCOUNTER — Ambulatory Visit (INDEPENDENT_AMBULATORY_CARE_PROVIDER_SITE_OTHER): Payer: Medicare Other | Admitting: Neurology

## 2016-05-07 VITALS — BP 110/81 | HR 84 | Ht 61.0 in | Wt 172.2 lb

## 2016-05-07 DIAGNOSIS — I63512 Cerebral infarction due to unspecified occlusion or stenosis of left middle cerebral artery: Secondary | ICD-10-CM | POA: Diagnosis not present

## 2016-05-07 DIAGNOSIS — I639 Cerebral infarction, unspecified: Secondary | ICD-10-CM

## 2016-05-07 NOTE — Patient Instructions (Addendum)
Stroke Prevention Some medical conditions and behaviors are associated with an increased chance of having a stroke. You may prevent a stroke by making healthy choices and managing medical conditions. HOW CAN I REDUCE MY RISK OF HAVING A STROKE?   Stay physically active. Get at least 30 minutes of activity on most or all days.  Do not smoke. It may also be helpful to avoid exposure to secondhand smoke.  Limit alcohol use. Moderate alcohol use is considered to be:  No more than 2 drinks per day for men.  No more than 1 drink per day for nonpregnant women.  Eat healthy foods. This involves:  Eating 5 or more servings of fruits and vegetables a day.  Making dietary changes that address high blood pressure (hypertension), high cholesterol, diabetes, or obesity.  Manage your cholesterol levels.  Making food choices that are high in fiber and low in saturated fat, trans fat, and cholesterol may control cholesterol levels.  Take any prescribed medicines to control cholesterol as directed by your health care provider.  Manage your diabetes.  Controlling your carbohydrate and sugar intake is recommended to manage diabetes.  Take any prescribed medicines to control diabetes as directed by your health care provider.  Control your hypertension.  Making food choices that are low in salt (sodium), saturated fat, trans fat, and cholesterol is recommended to manage hypertension.  Ask your health care provider if you need treatment to lower your blood pressure. Take any prescribed medicines to control hypertension as directed by your health care provider.  If you are 18-39 years of age, have your blood pressure checked every 3-5 years. If you are 40 years of age or older, have your blood pressure checked every year.  Maintain a healthy weight.  Reducing calorie intake and making food choices that are low in sodium, saturated fat, trans fat, and cholesterol are recommended to manage  weight.  Stop drug abuse.  Avoid taking birth control pills.  Talk to your health care provider about the risks of taking birth control pills if you are over 35 years old, smoke, get migraines, or have ever had a blood clot.  Get evaluated for sleep disorders (sleep apnea).  Talk to your health care provider about getting a sleep evaluation if you snore a lot or have excessive sleepiness.  Take medicines only as directed by your health care provider.  For some people, aspirin or blood thinners (anticoagulants) are helpful in reducing the risk of forming abnormal blood clots that can lead to stroke. If you have the irregular heart rhythm of atrial fibrillation, you should be on a blood thinner unless there is a good reason you cannot take them.  Understand all your medicine instructions.  Make sure that other conditions (such as anemia or atherosclerosis) are addressed. SEEK IMMEDIATE MEDICAL CARE IF:   You have sudden weakness or numbness of the face, arm, or leg, especially on one side of the body.  Your face or eyelid droops to one side.  You have sudden confusion.  You have trouble speaking (aphasia) or understanding.  You have sudden trouble seeing in one or both eyes.  You have sudden trouble walking.  You have dizziness.  You have a loss of balance or coordination.  You have a sudden, severe headache with no known cause.  You have new chest pain or an irregular heartbeat. Any of these symptoms may represent a serious problem that is an emergency. Do not wait to see if the symptoms will   go away. Get medical help at once. Call your local emergency services (911 in [place] Do not drive yourself to the hospital.   This information is not intended to replace advice given to you by your health care provider. Make sure you discuss any questions you have with your health care provider.   Document Released: 08/09/2004 Document Revised: 07/23/2014 Document Reviewed:  01/02/2013 Elsevier Interactive Patient Education 2016 [employer]  

## 2016-05-07 NOTE — Progress Notes (Signed)
[facility] [address] [place]. [place] [address] [phone]       OFFICE FOLLOW-UP NOTE  [name] Date of Birth:  Nov 11, 1965 Medical Record Number:  [id number]   HPI: [name] is a 50 year old Caucasian lady seen today for first office follow-up visit: following hospital admission for stroke in August 2017.She is accompanied by her daughter. Jill Taylor an 74 y.o.femalewho was seen last year on 02/09/2015 in which she had a left ICA supraclinoid clinoid balloon angioplasty. Patient and patient's daughter state after the angioplasty her speech seemed to improve. At times she would have intermittent expressive aphasia. And awoke this past Wednesday and noted that her expressive aphasia was worse. As what she wants to say but at times has hard time getting her words out. Over the last few days it has progressively worsened and now is consistent without intermittent periods. Currently her only complaint is at times difficulty saying the word that she would like to say. She denies any slurred speech,headache, numbness, tingling, weakness. She was last known well 02/29/2016, time unknown. Patient was not administered IV t-PA secondary to unknown time LKW. She was admitted for further evaluation and treatment. On physical exam she had a bizarre fluctuating intermittent expressive language difficulties at times and at times speech was quite fluent. MRI scan of the brain showed patchy left MCA infarct and MRA showed left MCA restenosis. CT angiogram showed dominant left ICA and origin left MCA and ACA stenosis which appeared to be stable compared with previous study. Patient subsequently frequently had an outpatient catheter angiogram performed by Dr. 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 LDL cholesterol was slightly elevated at 92 mg percent and hemoglobin A1c was  8.8 which actually was an improvement from previous hemoglobin A1c of 13. Patient was initially discharged on aspirin and Plavix but when she saw Dr. Estanislado Pandy he switched to Plavix to be present up. She is currently taking aspirin 325 mg daily and Brilinta 40 5 AM twice daily. She is tolerating it well without bleeding problems but does have easy bruising. She has had no recurrent stroke or TIA symptoms. She was planning to have a repeat left MCA angioplasty done but Dr. Estanislado Pandy this week but because of change in schedule this is now planned for 05/23/2016. She continues to smoke and has been trying to quit but has not been successful. She states her blood pressure sugar and cholesterol are doing better but not yet at goal. She had initially presented with left hemispheric TIA in November 2015 with memory loss and expressive aphasia and left-sided weakness and then in July 2016  subsequently underwent left MCA and terminal left ICA angioplasty on 02/09/15 however follow-up repeat catheter angiogram on 06/05/2015 showed 65-70% restenosis at the previously angioplastied site of the left middle cerebral and supra clinoid left ICA. She also has remote history of cerebral aneurysm clipping in 2007 and a family history of brain aneurysms and migraines.  ROS:   14 system review of systems is positive for  fatigue, ear pain, trouble swallowing, drooling, eye itching, redness, light sensitive today, blurred vision, cough, wheezing, shortness of breath, leg swelling, heat and cold intolerance, excessive thirst, abdominal swelling, constipation, diarrhea, nausea, restless leg, apnea, frequent waking, daytime sleepiness, snoring, allergies: Painful urination, frequency of urination, bladder urgency, joint and back pain, aching muscles, muscle cramps, walking difficulty, neck pain, neck stiffness, easy bruising and bleeding, memory  loss, dizziness, headache, numbness, speech difficulty, weakness, agitation, behavioral  problem, decreased concentration, depression, nervousness and anxiety.  PMH:  Past Medical History:  Diagnosis Date  . Anxiety   . Bipolar disorder ([place])   . Brain aneurysm   . COPD (chronic obstructive pulmonary disease) ([place])   . Depression   . 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 Dysrhythmia   . Fibromyalgia   . GERD (gastroesophageal reflux disease)   . Headache   . High cholesterol   . Hypertension    "not anymore"  . IBS (irritable bowel syndrome)   . Neuropathy ([place])   . Pneumonia 2013 ish  . PTSD (post-traumatic stress disorder)   . Shortness of breath dyspnea    with exertion  . Stroke [facility]) 05/2014   05/2014-TIA, TIA and CVA- 7 since 05/2014- memory loss and expressive aphasia. Left sided weakness. "2 in Auguat 2017- hospitalizized  Aug 21- Aug 30, @017  "they said I have weakness on left side, I dont see it."  . UTI (lower urinary tract infection)     Social History:  Social History   Social History  . Marital status: Married    Spouse name: N/A  . Number of children: N/A  . Years of education: N/A   Occupational History  . Not on file.   Social History Main Topics  . Smoking status: Current Every Day Smoker    Packs/day: 0.50    Years: 35.00    Types: Cigarettes, E-cigarettes  . Smokeless tobacco: Never Used     Comment: E-cigarettes- 16mg - "puffs on all day."  . Alcohol use No  . Drug use: No  . Sexual activity: Not on file   Other Topics Concern  . Not on file   Social History Narrative  . No narrative on file    Medications:   Current Outpatient Prescriptions on File Prior to Visit  Medication Sig Dispense Refill  . ACETAMINOPHEN-BUTALBITAL 50-325 MG TABS Take 2 tablets by mouth daily as needed (for headache or migraine).     Marland Kitchen albuterol (PROVENTIL HFA;VENTOLIN HFA) 108 (90 BASE) MCG/ACT inhaler Inhale 2 puffs into the lungs every 6 (six) hours as needed for wheezing or shortness of breath.    Marland Kitchen aspirin 325 MG  tablet Take 325 mg by mouth daily.    Marland Kitchen atorvastatin (LIPITOR) 40 MG tablet Take 2 tablets (80 mg total) by mouth at bedtime. 60 tablet 0  . Boric Acid POWD Place 1 capsule vaginally. Compound - in a capsule -vaginally every Sunday    . busPIRone (BUSPAR) 10 MG tablet Take 10 mg by mouth 2 (two) times daily.     . canagliflozin (INVOKANA) 300 MG TABS tablet Take 300 mg by mouth daily.    . cetirizine (ZYRTEC) 10 MG tablet Take 10 mg by mouth daily.  3  . clonazePAM (KLONOPIN) 0.5 MG tablet Take 1 tablet (0.5 mg total) by mouth 2 (two) times daily as needed for anxiety. 30 tablet 0  . Dulaglutide (TRULICITY) 1.5 0000000 SOPN Inject 1.5 mg into the skin once a week.    . Fluticasone Furoate-Vilanterol (BREO ELLIPTA) 100-25 MCG/INH AEPB Inhale 2 puffs into the lungs daily.     Marland Kitchen glipiZIDE (GLUCOTROL XL) 10 MG 24 hr tablet Take 10 mg by mouth 2 (two) times daily.    Marland Kitchen ipratropium-albuterol (DUONEB) 0.5-2.5 (3) MG/3ML SOLN Take 3 mLs by nebulization every 6 (six) hours as needed (for shortness of breath).    Marland Kitchen  Melatonin 10 MG TABS Take 10 mg by mouth at bedtime.    Marland Kitchen omeprazole (PRILOSEC) 40 MG capsule Take 40 mg by mouth 2 (two) times daily.  7  . polyethylene glycol (MIRALAX / GLYCOLAX) packet Take 17 g by mouth daily as needed for moderate constipation.    . QUEtiapine (SEROQUEL) 25 MG tablet Take 25 mg by mouth 2 (two) times daily.     . traMADol (ULTRAM) 50 MG tablet Take 50 mg by mouth 2 (two) times daily as needed for moderate pain.     No current facility-administered medications on file prior to visit.     Allergies:   Allergies  Allergen Reactions  . Other Shortness Of Breath    Allergy to pine trees, hickory trees and mulberry trees & 2 types of fungus   . Sulfa Antibiotics Hives    Physical Exam General: slightly overweight middle aged Caucasian lady, seated, in no evident distress Head: head normocephalic and atraumatic.  Neck: supple with no carotid or supraclavicular  bruits Cardiovascular: regular rate and rhythm, no murmurs Musculoskeletal: no deformity Skin:  no rash/petichiae Vascular:  Normal pulses all extremities Vitals:   05/07/16 0838  BP: 110/81  Pulse: 84   Neurologic Exam Mental Status: Awake and fully alert. Oriented to place and time. Recent and remote memory intact. Attention span, concentration and fund of knowledge appropriate. Mood and affect appropriate.  Cranial Nerves: Fundoscopic exam reveals sharp disc margins. Pupils equal, briskly reactive to light. Extraocular movements full without nystagmus. Visual fields full to confrontation. Hearing intact. Facial sensation intact. Face, tongue, palate moves normally and symmetrically.  Motor: Normal bulk and tone. Normal strength in all tested extremity muscles.Mildly diminished fine finger movements on the left. Orbits right over left approximately. Sensory.: intact to touch ,pinprick .position and vibratory sensation.  Coordination: Rapid alternating movements normal in all extremities. Finger-to-nose and heel-to-shin performed accurately bilaterally. Gait and Station: Arises from chair without difficulty. Stance is normal. Gait demonstrates normal stride length and balance . Able to heel, toe and tandem walk with slight difficulty.  Reflexes: 1+ and symmetric. Toes downgoing.   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 Multiple vascular risk factors of smoking, hyperlipidemia, hypertension and diabetes and obesity.    PLAN: I had a long d/w patient and her daughter about her recent stroke, left middle cerebral artery restenosis,risk for recurrent stroke/TIAs, personally independently reviewed imaging studies and stroke evaluation results and answered questions.Continue aspirin 325 mg daily and  Brillinta 45 mg twice daily for secondary stroke prevention and maintain strict control of hypertension with blood pressure goal below 130/90, diabetes with hemoglobin A1c goal below 6.5% and lipids with LDL cholesterol goal below 70 mg/dL.Jill Taylor strongly encouraged her to quit smoking completely as well. I also advised the patient to eat a healthy diet with plenty of whole grains, cereals, fruits and vegetables, exercise regularly and maintain ideal body weight Check follow up transcranial doppler study and keep appointment with Dr Estanislado Pandy for repeat left MCA angioplasty on 05/23/2016.Followup in the future with me in 6 months or call earlier if necessary. Greater than 50% of time during this 25 minute visit was spent on counseling,explanation of diagnosis, planning of further management, discussion with patient and family and coordination of care Antony Contras, MD  [facility] [place]  [place], [place] [address]  Phone [phone] Fax [phone] Note: This document was prepared with digital dictation and possible smart phrase technology. Any transcriptional errors that result from this process are unintentional

## 2016-05-08 ENCOUNTER — Telehealth: Payer: Self-pay | Admitting: Neurology

## 2016-05-08 NOTE — Telephone Encounter (Signed)
Called and spoke to [facility] and relayed no Auth was not  needed for [phone] and [phone]. Doppler   Spoke to [employer]. Eastern standard time 4:10 pm .   [place] Patient is ready to be scheduled. Thanks Hinton Dyer.

## 2016-05-16 ENCOUNTER — Ambulatory Visit (INDEPENDENT_AMBULATORY_CARE_PROVIDER_SITE_OTHER): Payer: Medicare Other

## 2016-05-16 DIAGNOSIS — I63512 Cerebral infarction due to unspecified occlusion or stenosis of left middle cerebral artery: Secondary | ICD-10-CM | POA: Diagnosis not present

## 2016-05-22 ENCOUNTER — Encounter (HOSPITAL_COMMUNITY): Payer: Self-pay | Admitting: *Deleted

## 2016-05-22 ENCOUNTER — Other Ambulatory Visit: Payer: Self-pay | Admitting: Radiology

## 2016-05-22 ENCOUNTER — Other Ambulatory Visit: Payer: Self-pay | Admitting: General Surgery

## 2016-05-22 NOTE — Progress Notes (Signed)
Pt denies SOB, chest pain, and being under the care of a cardiologist. Pt denies having a stress test, and cardiac cath. Pt denies having a chest x ray within the last year. Pt made aware to stop taking vitamins, fish oil and herbal medications. Do not take any NSAIDs ie: Ibuprofen, Advil, Naproxen , BC and Goody Powder. Pt stated that her fasting blood glucose is usually " around 250."  Pt made aware of diabetes protocol to not take Glimepiride tonight or DOS and no Invokana DOS. Pt made aware to check blood glucose every 2 hours prior to arrival, interventions for blood glucose <70 and >220 and # to SS. Pt verbalized understanding of all pre-op instructions.

## 2016-05-22 NOTE — Anesthesia Preprocedure Evaluation (Addendum)
Anesthesia Evaluation  Patient identified by MRN, date of birth, ID band  Reviewed: Allergy & Precautions, NPO status , Patient's Chart, lab work & pertinent test results  Airway Mallampati: I  TM Distance: >3 FB Neck ROM: Full    Dental  (+) Edentulous Upper, Edentulous Lower, Dental Advisory Given   Pulmonary COPD, Current Smoker,    breath sounds clear to auscultation       Cardiovascular hypertension, + Peripheral Vascular Disease  + dysrhythmias  Rhythm:Regular Rate:Normal     Neuro/Psych  Headaches, PSYCHIATRIC DISORDERS Anxiety Cerebral aneurysm CVA, Residual Symptoms    GI/Hepatic Neg liver ROS, GERD  Medicated,  Endo/Other  diabetes, Oral Hypoglycemic Agents  Renal/GU negative Renal ROS     Musculoskeletal   Abdominal   Peds  Hematology negative hematology ROS (+)   Anesthesia Other Findings   Reproductive/Obstetrics                           Anesthesia Physical  Anesthesia Plan  ASA: III  Anesthesia Plan: General   Post-op Pain Management:    Induction: Intravenous  Airway Management Planned: Oral ETT  Additional Equipment: Arterial line  Intra-op Plan:   Post-operative Plan: Extubation in OR  Informed Consent: I have reviewed the patients History and Physical, chart, labs and discussed the procedure including the risks, benefits and alternatives for the proposed anesthesia with the patient or authorized representative who has indicated his/her understanding and acceptance.   Dental advisory given  Plan Discussed with:   Anesthesia Plan Comments:         Anesthesia Quick Evaluation

## 2016-05-23 ENCOUNTER — Encounter (HOSPITAL_COMMUNITY)
Admission: RE | Disposition: A | Discharge status: Home / Self Care | Payer: Self-pay | Source: Ambulatory Visit | Attending: Interventional Radiology

## 2016-05-23 ENCOUNTER — Inpatient Hospital Stay (HOSPITAL_COMMUNITY): Payer: Medicare Other

## 2016-05-23 ENCOUNTER — Ambulatory Visit (HOSPITAL_COMMUNITY)
Admission: RE | Admit: 2016-05-23 | Discharge: 2016-05-23 | Disposition: A | Discharge status: Home / Self Care | Payer: Medicare Other | Source: Ambulatory Visit | Attending: Interventional Radiology | Admitting: Interventional Radiology

## 2016-05-23 ENCOUNTER — Ambulatory Visit (HOSPITAL_COMMUNITY): Payer: Medicare Other | Admitting: Anesthesiology

## 2016-05-23 ENCOUNTER — Encounter (HOSPITAL_COMMUNITY): Payer: Self-pay | Admitting: *Deleted

## 2016-05-23 ENCOUNTER — Inpatient Hospital Stay (HOSPITAL_COMMUNITY)
Admission: RE | Admit: 2016-05-23 | Discharge: 2016-05-25 | DRG: 064 | Disposition: A | Discharge status: Home / Self Care | Payer: Medicare Other | Source: Ambulatory Visit | Attending: Interventional Radiology | Admitting: Interventional Radiology

## 2016-05-23 ENCOUNTER — Ambulatory Visit: Payer: Self-pay | Admitting: Neurology

## 2016-05-23 ENCOUNTER — Ambulatory Visit (HOSPITAL_COMMUNITY): Payer: Medicare Other

## 2016-05-23 DIAGNOSIS — E785 Hyperlipidemia, unspecified: Secondary | ICD-10-CM | POA: Diagnosis not present

## 2016-05-23 DIAGNOSIS — J9600 Acute respiratory failure, unspecified whether with hypoxia or hypercapnia: Secondary | ICD-10-CM

## 2016-05-23 DIAGNOSIS — Q251 Coarctation of aorta: Secondary | ICD-10-CM

## 2016-05-23 DIAGNOSIS — Z9851 Tubal ligation status: Secondary | ICD-10-CM | POA: Diagnosis not present

## 2016-05-23 DIAGNOSIS — J9601 Acute respiratory failure with hypoxia: Secondary | ICD-10-CM

## 2016-05-23 DIAGNOSIS — Z452 Encounter for adjustment and management of vascular access device: Secondary | ICD-10-CM | POA: Diagnosis not present

## 2016-05-23 DIAGNOSIS — Z6832 Body mass index (BMI) 32.0-32.9, adult: Secondary | ICD-10-CM

## 2016-05-23 DIAGNOSIS — R6889 Other general symptoms and signs: Secondary | ICD-10-CM

## 2016-05-23 DIAGNOSIS — Z716 Tobacco abuse counseling: Secondary | ICD-10-CM | POA: Diagnosis not present

## 2016-05-23 DIAGNOSIS — Z9049 Acquired absence of other specified parts of digestive tract: Secondary | ICD-10-CM

## 2016-05-23 DIAGNOSIS — M797 Fibromyalgia: Secondary | ICD-10-CM | POA: Diagnosis not present

## 2016-05-23 DIAGNOSIS — I6602 Occlusion and stenosis of left middle cerebral artery: Secondary | ICD-10-CM | POA: Diagnosis not present

## 2016-05-23 DIAGNOSIS — K219 Gastro-esophageal reflux disease without esophagitis: Secondary | ICD-10-CM | POA: Diagnosis not present

## 2016-05-23 DIAGNOSIS — I69320 Aphasia following cerebral infarction: Secondary | ICD-10-CM | POA: Diagnosis not present

## 2016-05-23 DIAGNOSIS — E1165 Type 2 diabetes mellitus with hyperglycemia: Secondary | ICD-10-CM | POA: Diagnosis present

## 2016-05-23 DIAGNOSIS — Z90710 Acquired absence of both cervix and uterus: Secondary | ICD-10-CM

## 2016-05-23 DIAGNOSIS — Z8701 Personal history of pneumonia (recurrent): Secondary | ICD-10-CM

## 2016-05-23 DIAGNOSIS — E1140 Type 2 diabetes mellitus with diabetic neuropathy, unspecified: Secondary | ICD-10-CM | POA: Diagnosis present

## 2016-05-23 DIAGNOSIS — I10 Essential (primary) hypertension: Secondary | ICD-10-CM | POA: Diagnosis not present

## 2016-05-23 DIAGNOSIS — Z823 Family history of stroke: Secondary | ICD-10-CM

## 2016-05-23 DIAGNOSIS — E669 Obesity, unspecified: Secondary | ICD-10-CM | POA: Diagnosis present

## 2016-05-23 DIAGNOSIS — Z7982 Long term (current) use of aspirin: Secondary | ICD-10-CM

## 2016-05-23 DIAGNOSIS — I69354 Hemiplegia and hemiparesis following cerebral infarction affecting left non-dominant side: Secondary | ICD-10-CM | POA: Diagnosis not present

## 2016-05-23 DIAGNOSIS — Z794 Long term (current) use of insulin: Secondary | ICD-10-CM

## 2016-05-23 DIAGNOSIS — F17210 Nicotine dependence, cigarettes, uncomplicated: Secondary | ICD-10-CM | POA: Diagnosis present

## 2016-05-23 DIAGNOSIS — Z882 Allergy status to sulfonamides status: Secondary | ICD-10-CM | POA: Diagnosis not present

## 2016-05-23 DIAGNOSIS — I63512 Cerebral infarction due to unspecified occlusion or stenosis of left middle cerebral artery: Secondary | ICD-10-CM | POA: Diagnosis not present

## 2016-05-23 DIAGNOSIS — Z888 Allergy status to other drugs, medicaments and biological substances status: Secondary | ICD-10-CM

## 2016-05-23 DIAGNOSIS — D72829 Elevated white blood cell count, unspecified: Secondary | ICD-10-CM | POA: Diagnosis present

## 2016-05-23 DIAGNOSIS — J449 Chronic obstructive pulmonary disease, unspecified: Secondary | ICD-10-CM | POA: Diagnosis present

## 2016-05-23 DIAGNOSIS — E876 Hypokalemia: Secondary | ICD-10-CM | POA: Diagnosis present

## 2016-05-23 DIAGNOSIS — I69311 Memory deficit following cerebral infarction: Secondary | ICD-10-CM

## 2016-05-23 DIAGNOSIS — Z4659 Encounter for fitting and adjustment of other gastrointestinal appliance and device: Secondary | ICD-10-CM

## 2016-05-23 DIAGNOSIS — I771 Stricture of artery: Secondary | ICD-10-CM

## 2016-05-23 HISTORY — PX: IR GENERIC HISTORICAL: IMG1180011

## 2016-05-23 HISTORY — PX: RADIOLOGY WITH ANESTHESIA: SHX6223

## 2016-05-23 LAB — CBC WITH DIFFERENTIAL/PLATELET
Basophils Absolute: 0.1 K/uL (ref 0.0–0.1)
Basophils Relative: 1 %
Eosinophils Absolute: 0.2 K/uL (ref 0.0–0.7)
Eosinophils Relative: 2 %
HCT: 45 % (ref 36.0–46.0)
Hemoglobin: 14.8 g/dL (ref 12.0–15.0)
Lymphocytes Relative: 30 %
Lymphs Abs: 2.5 K/uL (ref 0.7–4.0)
MCH: 28.1 pg (ref 26.0–34.0)
MCHC: 32.9 g/dL (ref 30.0–36.0)
MCV: 85.4 fL (ref 78.0–100.0)
Monocytes Absolute: 0.5 K/uL (ref 0.1–1.0)
Monocytes Relative: 6 %
Neutro Abs: 5.2 K/uL (ref 1.7–7.7)
Neutrophils Relative %: 61 %
Platelets: 165 K/uL (ref 150–400)
RBC: 5.27 MIL/uL — ABNORMAL HIGH (ref 3.87–5.11)
RDW: 14.3 % (ref 11.5–15.5)
WBC: 8.5 K/uL (ref 4.0–10.5)

## 2016-05-23 LAB — RAPID URINE DRUG SCREEN, HOSP PERFORMED
AMPHETAMINES: NOT DETECTED
BARBITURATES: NOT DETECTED
Benzodiazepines: POSITIVE — AB
Cocaine: NOT DETECTED
OPIATES: NOT DETECTED
TETRAHYDROCANNABINOL: NOT DETECTED

## 2016-05-23 LAB — BASIC METABOLIC PANEL
Anion gap: 11 (ref 5–15)
BUN: 7 mg/dL (ref 6–20)
CHLORIDE: 104 mmol/L (ref 101–111)
CO2: 21 mmol/L — AB (ref 22–32)
CREATININE: 0.78 mg/dL (ref 0.44–1.00)
Calcium: 9.3 mg/dL (ref 8.9–10.3)
GFR calc Af Amer: >60 mL/min (ref >60)
GFR calc non Af Amer: >60 mL/min (ref >60)
Glucose, Bld: 309 mg/dL — ABNORMAL HIGH (ref 65–99)
POTASSIUM: 3.8 mmol/L (ref 3.5–5.1)
Sodium: 136 mmol/L (ref 135–145)

## 2016-05-23 LAB — BLOOD GAS, ARTERIAL
ACID-BASE DEFICIT: 3.9 mmol/L — AB (ref 0.0–2.0)
BICARBONATE: 21.6 mmol/L (ref 20.0–28.0)
Drawn by: 244851
FIO2: 50
LHR: 15 {breaths}/min
O2 SAT: 97 %
PEEP/CPAP: 5 cmH2O
PH ART: 7.3 — AB (ref 7.350–7.450)
Patient temperature: 98.1
VT: 400 mL
pCO2 arterial: 44.9 mmHg (ref 32.0–48.0)
pO2, Arterial: 119 mmHg — ABNORMAL HIGH (ref 83.0–108.0)

## 2016-05-23 LAB — POCT ACTIVATED CLOTTING TIME
ACTIVATED CLOTTING TIME: 164 s
Activated Clotting Time: 180 seconds
Activated Clotting Time: 186 seconds

## 2016-05-23 LAB — MRSA PCR SCREENING: MRSA by PCR: NEGATIVE

## 2016-05-23 LAB — PROTIME-INR
INR: 0.88
Prothrombin Time: 11.9 seconds (ref 11.4–15.2)

## 2016-05-23 LAB — HEPARIN LEVEL (UNFRACTIONATED): Heparin Unfractionated: 0.11 IU/mL — ABNORMAL LOW (ref 0.30–0.70)

## 2016-05-23 LAB — GLUCOSE, CAPILLARY
Glucose-Capillary: 232 mg/dL — ABNORMAL HIGH (ref 65–99)
Glucose-Capillary: 217 mg/dL — ABNORMAL HIGH (ref 65–99)
Glucose-Capillary: 179 mg/dL — ABNORMAL HIGH (ref 65–99)
Glucose-Capillary: 143 mg/dL — ABNORMAL HIGH (ref 65–99)
Glucose-Capillary: 149 mg/dL — ABNORMAL HIGH (ref 65–99)
Glucose-Capillary: 152 mg/dL — ABNORMAL HIGH (ref 65–99)

## 2016-05-23 LAB — PLATELET INHIBITION P2Y12: PLATELET FUNCTION P2Y12: 47 [PRU] — AB (ref 194–418)

## 2016-05-23 LAB — TRIGLYCERIDES: Triglycerides: 107 mg/dL (ref <150)

## 2016-05-23 SURGERY — RADIOLOGY WITH ANESTHESIA
Anesthesia: General

## 2016-05-23 MED ORDER — CEFAZOLIN SODIUM-DEXTROSE 2-4 GM/100ML-% IV SOLN
2.0000 g | INTRAVENOUS | Status: AC
  Administered 2016-05-23: 2 g via INTRAVENOUS
  Filled 2016-05-23: qty 100

## 2016-05-23 MED ORDER — CLONAZEPAM 0.5 MG PO TABS: 0.5000 mg | ORAL_TABLET | Freq: Two times a day (BID) | ORAL | Status: DC | PRN

## 2016-05-23 MED ORDER — ASPIRIN 81 MG PO CHEW: 81.0000 mg | CHEWABLE_TABLET | Freq: Every day | ORAL | Status: DC

## 2016-05-23 MED ORDER — NICARDIPINE HCL IN NACL 20-0.86 MG/200ML-% IV SOLN
5.0000 mg/h | INTRAVENOUS | Status: DC
  Administered 2016-05-23: 15 mg/h via INTRAVENOUS
  Administered 2016-05-24: 10 mg/h via INTRAVENOUS
  Filled 2016-05-23 (×2): qty 200

## 2016-05-23 MED ORDER — LIDOCAINE HCL 1 % IJ SOLN
INTRAMUSCULAR | Status: AC
  Filled 2016-05-23: qty 20

## 2016-05-23 MED ORDER — ALBUTEROL SULFATE HFA 108 (90 BASE) MCG/ACT IN AERS: 2.0000 | INHALATION_SPRAY | Freq: Four times a day (QID) | RESPIRATORY_TRACT | Status: DC | PRN

## 2016-05-23 MED ORDER — HEPARIN (PORCINE) IN NACL 100-0.45 UNIT/ML-% IJ SOLN
650.0000 [IU]/h | INTRAMUSCULAR | Status: DC
  Filled 2016-05-23: qty 250

## 2016-05-23 MED ORDER — BUSPIRONE HCL 10 MG PO TABS
10.0000 mg | ORAL_TABLET | Freq: Two times a day (BID) | ORAL | Status: DC
  Administered 2016-05-23 – 2016-05-25 (×4): 10 mg via ORAL
  Filled 2016-05-23 (×4): qty 1

## 2016-05-23 MED ORDER — LACTATED RINGERS IV SOLN: INTRAVENOUS | Status: DC | PRN

## 2016-05-23 MED ORDER — ACETAMINOPHEN 500 MG PO TABS
1000.0000 mg | ORAL_TABLET | Freq: Four times a day (QID) | ORAL | Status: DC | PRN
  Administered 2016-05-24: 1000 mg via ORAL
  Filled 2016-05-23: qty 2

## 2016-05-23 MED ORDER — BUDESONIDE 0.5 MG/2ML IN SUSP
0.5000 mg | Freq: Two times a day (BID) | RESPIRATORY_TRACT | Status: DC
  Administered 2016-05-23 – 2016-05-25 (×4): 0.5 mg via RESPIRATORY_TRACT
  Filled 2016-05-23 (×4): qty 2

## 2016-05-23 MED ORDER — LIDOCAINE HCL 1 % IJ SOLN
INTRAMUSCULAR | Status: AC | PRN
  Administered 2016-05-23: 5 mL

## 2016-05-23 MED ORDER — ONDANSETRON HCL 4 MG/2ML IJ SOLN
INTRAMUSCULAR | Status: DC | PRN
  Administered 2016-05-23: 4 mg via INTRAVENOUS

## 2016-05-23 MED ORDER — LIDOCAINE HCL (CARDIAC) 20 MG/ML IV SOLN
INTRAVENOUS | Status: DC | PRN
  Administered 2016-05-23: 100 mg via INTRAVENOUS

## 2016-05-23 MED ORDER — ESMOLOL HCL 100 MG/10ML IV SOLN: INTRAVENOUS | Status: DC | PRN

## 2016-05-23 MED ORDER — MIDAZOLAM HCL 2 MG/2ML IJ SOLN: 1.0000 mg | Freq: Once | INTRAMUSCULAR | Status: AC

## 2016-05-23 MED ORDER — CANAGLIFLOZIN 300 MG PO TABS
300.0000 mg | ORAL_TABLET | Freq: Every day | ORAL | Status: DC
  Administered 2016-05-23 – 2016-05-25 (×3): 300 mg via ORAL
  Filled 2016-05-23 (×3): qty 1

## 2016-05-23 MED ORDER — PROTAMINE SULFATE 10 MG/ML IV SOLN
INTRAVENOUS | Status: DC | PRN
  Administered 2016-05-23: 5 mg via INTRAVENOUS

## 2016-05-23 MED ORDER — GLIPIZIDE ER 10 MG PO TB24
10.0000 mg | ORAL_TABLET | Freq: Two times a day (BID) | ORAL | Status: DC
  Filled 2016-05-23 (×2): qty 1

## 2016-05-23 MED ORDER — FENTANYL CITRATE (PF) 100 MCG/2ML IJ SOLN
100.0000 ug | INTRAMUSCULAR | Status: DC | PRN
  Filled 2016-05-23: qty 2

## 2016-05-23 MED ORDER — GABAPENTIN ENACARBIL ER 600 MG PO TBCR: 600.0000 mg | EXTENDED_RELEASE_TABLET | Freq: Every day | ORAL | Status: DC

## 2016-05-23 MED ORDER — LOPERAMIDE HCL 2 MG PO CAPS
2.0000 mg | ORAL_CAPSULE | ORAL | Status: DC | PRN
  Filled 2016-05-23: qty 1

## 2016-05-23 MED ORDER — ESMOLOL HCL 100 MG/10ML IV SOLN
INTRAVENOUS | Status: DC | PRN
  Administered 2016-05-23: 30 mg via INTRAVENOUS
  Administered 2016-05-23 (×2): 20 mg via INTRAVENOUS
  Administered 2016-05-23: 10 mg via INTRAVENOUS

## 2016-05-23 MED ORDER — LORATADINE 10 MG PO TABS
10.0000 mg | ORAL_TABLET | Freq: Every day | ORAL | Status: DC
  Administered 2016-05-23 – 2016-05-25 (×3): 10 mg via ORAL
  Filled 2016-05-23 (×3): qty 1

## 2016-05-23 MED ORDER — CHLORHEXIDINE GLUCONATE 0.12% ORAL RINSE (MEDLINE KIT)
15.0000 mL | Freq: Two times a day (BID) | OROMUCOSAL | Status: DC
  Administered 2016-05-23: 15 mL via OROMUCOSAL

## 2016-05-23 MED ORDER — MIDAZOLAM HCL 2 MG/2ML IJ SOLN
INTRAMUSCULAR | Status: AC
  Administered 2016-05-23: 1 mg
  Filled 2016-05-23: qty 4

## 2016-05-23 MED ORDER — SODIUM CHLORIDE 0.9 % IV SOLN
INTRAVENOUS | Status: DC | PRN
  Administered 2016-05-23 (×2): via INTRAVENOUS

## 2016-05-23 MED ORDER — SODIUM CHLORIDE 0.9 % IV SOLN
INTRAVENOUS | Status: DC
  Administered 2016-05-23 (×2): via INTRAVENOUS

## 2016-05-23 MED ORDER — NITROGLYCERIN 1 MG/10 ML FOR IR/CATH LAB
INTRA_ARTERIAL | Status: AC
  Filled 2016-05-23: qty 10

## 2016-05-23 MED ORDER — ONDANSETRON HCL 4 MG/2ML IJ SOLN
4.0000 mg | Freq: Four times a day (QID) | INTRAMUSCULAR | Status: DC | PRN
  Administered 2016-05-24: 4 mg via INTRAVENOUS
  Filled 2016-05-23: qty 2

## 2016-05-23 MED ORDER — ORAL CARE MOUTH RINSE
15.0000 mL | Freq: Four times a day (QID) | OROMUCOSAL | Status: DC
  Administered 2016-05-23 – 2016-05-24 (×3): 15 mL via OROMUCOSAL

## 2016-05-23 MED ORDER — NICARDIPINE HCL IN NACL 20-0.86 MG/200ML-% IV SOLN
INTRAVENOUS | Status: AC
  Administered 2016-05-23: 5 mg
  Filled 2016-05-23: qty 200

## 2016-05-23 MED ORDER — IPRATROPIUM-ALBUTEROL 0.5-2.5 (3) MG/3ML IN SOLN: 3.0000 mL | Freq: Four times a day (QID) | RESPIRATORY_TRACT | Status: DC | PRN

## 2016-05-23 MED ORDER — ASPIRIN 325 MG PO TABS
325.0000 mg | ORAL_TABLET | Freq: Every day | ORAL | Status: DC
  Administered 2016-05-24 – 2016-05-25 (×2): 325 mg via ORAL
  Filled 2016-05-23 (×2): qty 1

## 2016-05-23 MED ORDER — CEFAZOLIN SODIUM-DEXTROSE 2-4 GM/100ML-% IV SOLN
INTRAVENOUS | Status: AC
  Filled 2016-05-23: qty 100

## 2016-05-23 MED ORDER — INSULIN ASPART 100 UNIT/ML ~~LOC~~ SOLN
0.0000 [IU] | SUBCUTANEOUS | Status: DC
  Administered 2016-05-23: 3 [IU] via SUBCUTANEOUS
  Administered 2016-05-23: 2 [IU] via SUBCUTANEOUS
  Administered 2016-05-24: 8 [IU] via SUBCUTANEOUS
  Administered 2016-05-24 (×2): 2 [IU] via SUBCUTANEOUS

## 2016-05-23 MED ORDER — POLYETHYLENE GLYCOL 3350 17 G PO PACK: 17.0000 g | PACK | Freq: Every day | ORAL | Status: DC | PRN

## 2016-05-23 MED ORDER — IPRATROPIUM-ALBUTEROL 0.5-2.5 (3) MG/3ML IN SOLN
3.0000 mL | Freq: Four times a day (QID) | RESPIRATORY_TRACT | Status: DC
  Administered 2016-05-23 – 2016-05-25 (×7): 3 mL via RESPIRATORY_TRACT
  Filled 2016-05-23 (×7): qty 3

## 2016-05-23 MED ORDER — QUETIAPINE FUMARATE 25 MG PO TABS
25.0000 mg | ORAL_TABLET | Freq: Two times a day (BID) | ORAL | Status: DC
  Administered 2016-05-23 – 2016-05-25 (×4): 25 mg via ORAL
  Filled 2016-05-23 (×4): qty 1

## 2016-05-23 MED ORDER — TICAGRELOR 90 MG PO TABS: 90.0000 mg | ORAL_TABLET | Freq: Every day | ORAL | Status: DC

## 2016-05-23 MED ORDER — PROPOFOL 10 MG/ML IV BOLUS
INTRAVENOUS | Status: DC | PRN
  Administered 2016-05-23: 50 mg via INTRAVENOUS
  Administered 2016-05-23: 120 mg via INTRAVENOUS
  Administered 2016-05-23: 50 mg via INTRAVENOUS
  Administered 2016-05-23: 30 mg via INTRAVENOUS

## 2016-05-23 MED ORDER — TICAGRELOR 90 MG PO TABS
45.0000 mg | ORAL_TABLET | Freq: Two times a day (BID) | ORAL | Status: DC
  Filled 2016-05-23: qty 1

## 2016-05-23 MED ORDER — PANTOPRAZOLE SODIUM 40 MG PO TBEC
40.0000 mg | DELAYED_RELEASE_TABLET | Freq: Every day | ORAL | Status: DC
  Administered 2016-05-24 – 2016-05-25 (×2): 40 mg via ORAL
  Filled 2016-05-23 (×2): qty 1

## 2016-05-23 MED ORDER — BUTALBITAL-ACETAMINOPHEN 50-325 MG PO TABS: 2.0000 | ORAL_TABLET | Freq: Every day | ORAL | Status: DC | PRN

## 2016-05-23 MED ORDER — IPRATROPIUM-ALBUTEROL 0.5-2.5 (3) MG/3ML IN SOLN
RESPIRATORY_TRACT | Status: AC
  Administered 2016-05-23: 3 mL via RESPIRATORY_TRACT
  Filled 2016-05-23: qty 3

## 2016-05-23 MED ORDER — HEPARIN (PORCINE) IN NACL 100-0.45 UNIT/ML-% IJ SOLN
650.0000 [IU]/h | INTRAMUSCULAR | Status: AC
  Administered 2016-05-23: 650 [IU]/h via INTRAVENOUS
  Filled 2016-05-23: qty 250

## 2016-05-23 MED ORDER — DULAGLUTIDE 1.5 MG/0.5ML ~~LOC~~ SOAJ: 1.5000 mg | SUBCUTANEOUS | Status: DC

## 2016-05-23 MED ORDER — TRAMADOL HCL 50 MG PO TABS
50.0000 mg | ORAL_TABLET | Freq: Two times a day (BID) | ORAL | Status: DC | PRN
  Administered 2016-05-25: 50 mg via ORAL
  Filled 2016-05-23: qty 1

## 2016-05-23 MED ORDER — IOPAMIDOL (ISOVUE-300) INJECTION 61%
INTRAVENOUS | Status: AC
  Administered 2016-05-23: 140 mL
  Filled 2016-05-23: qty 150

## 2016-05-23 MED ORDER — TICAGRELOR 90 MG PO TABS
45.0000 mg | ORAL_TABLET | Freq: Once | ORAL | Status: AC
  Administered 2016-05-23: 45 mg via ORAL
  Filled 2016-05-23: qty 1

## 2016-05-23 MED ORDER — SODIUM CHLORIDE 0.9 % IV SOLN
INTRAVENOUS | Status: DC
  Administered 2016-05-23: 15:00:00 via INTRAVENOUS

## 2016-05-23 MED ORDER — FENTANYL CITRATE (PF) 100 MCG/2ML IJ SOLN
100.0000 ug | INTRAMUSCULAR | Status: DC | PRN
  Administered 2016-05-23 – 2016-05-24 (×5): 100 ug via INTRAVENOUS
  Filled 2016-05-23 (×5): qty 2

## 2016-05-23 MED ORDER — SODIUM CHLORIDE 0.9 % IJ SOLN
INTRAVENOUS | Status: AC | PRN
  Administered 2016-05-23 (×4): 25 ug via INTRA_ARTERIAL

## 2016-05-23 MED ORDER — FAMOTIDINE IN NACL 20-0.9 MG/50ML-% IV SOLN
20.0000 mg | INTRAVENOUS | Status: DC
  Administered 2016-05-23 – 2016-05-24 (×2): 20 mg via INTRAVENOUS
  Filled 2016-05-23 (×2): qty 50

## 2016-05-23 MED ORDER — ATORVASTATIN CALCIUM 80 MG PO TABS: 80.0000 mg | ORAL_TABLET | Freq: Every day | ORAL | Status: DC

## 2016-05-23 MED ORDER — FENTANYL CITRATE (PF) 100 MCG/2ML IJ SOLN
INTRAMUSCULAR | Status: AC
  Administered 2016-05-23: 100 ug
  Filled 2016-05-23: qty 4

## 2016-05-23 MED ORDER — HEPARIN (PORCINE) IN NACL 100-0.45 UNIT/ML-% IJ SOLN
500.0000 [IU]/h | INTRAMUSCULAR | Status: DC
  Filled 2016-05-23: qty 250

## 2016-05-23 MED ORDER — FENTANYL CITRATE (PF) 100 MCG/2ML IJ SOLN
INTRAMUSCULAR | Status: DC | PRN
  Administered 2016-05-23 (×2): 50 ug via INTRAVENOUS
  Administered 2016-05-23: 100 ug via INTRAVENOUS
  Administered 2016-05-23 (×3): 50 ug via INTRAVENOUS
  Administered 2016-05-23: 100 ug via INTRAVENOUS
  Administered 2016-05-23: 50 ug via INTRAVENOUS

## 2016-05-23 MED ORDER — ALBUTEROL SULFATE (2.5 MG/3ML) 0.083% IN NEBU
2.5000 mg | INHALATION_SOLUTION | RESPIRATORY_TRACT | Status: DC | PRN
Start: 2016-05-23 — End: 2016-05-25

## 2016-05-23 MED ORDER — FLUTICASONE FUROATE-VILANTEROL 100-25 MCG/INH IN AEPB
2.0000 | INHALATION_SPRAY | Freq: Every day | RESPIRATORY_TRACT | Status: DC
  Administered 2016-05-25: 2 via RESPIRATORY_TRACT
  Filled 2016-05-23: qty 28

## 2016-05-23 MED ORDER — HYDROCODONE-ACETAMINOPHEN 5-325 MG PO TABS
0.5000 | ORAL_TABLET | Freq: Every day | ORAL | Status: DC | PRN
  Administered 2016-05-24: 1 via ORAL
  Filled 2016-05-23: qty 1

## 2016-05-23 MED ORDER — INSULIN REGULAR HUMAN 100 UNIT/ML IJ SOLN
INTRAMUSCULAR | Status: DC
  Administered 2016-05-23: 1.6 [IU]/h via INTRAVENOUS
  Filled 2016-05-23: qty 2.5

## 2016-05-23 MED ORDER — ACETAMINOPHEN 650 MG RE SUPP: 650.0000 mg | Freq: Four times a day (QID) | RECTAL | Status: DC | PRN

## 2016-05-23 MED ORDER — FENTANYL CITRATE (PF) 100 MCG/2ML IJ SOLN: 100.0000 ug | Freq: Once | INTRAMUSCULAR | Status: AC

## 2016-05-23 MED ORDER — PHENYLEPHRINE HCL 10 MG/ML IJ SOLN
INTRAMUSCULAR | Status: DC | PRN
  Administered 2016-05-23 (×2): 40 ug via INTRAVENOUS
  Administered 2016-05-23 (×2): 20 ug via INTRAVENOUS
  Administered 2016-05-23: 80 ug via INTRAVENOUS
  Administered 2016-05-23: 20 ug via INTRAVENOUS
  Administered 2016-05-23 (×2): 40 ug via INTRAVENOUS
  Administered 2016-05-23: 20 ug via INTRAVENOUS

## 2016-05-23 MED ORDER — PROPOFOL 500 MG/50ML IV EMUL
INTRAVENOUS | Status: DC | PRN
  Administered 2016-05-23: 50 ug/kg/min via INTRAVENOUS

## 2016-05-23 MED ORDER — HYDROMORPHONE HCL 1 MG/ML IJ SOLN: 0.2500 mg | INTRAMUSCULAR | Status: DC | PRN

## 2016-05-23 MED ORDER — ATORVASTATIN CALCIUM 80 MG PO TABS
80.0000 mg | ORAL_TABLET | Freq: Every day | ORAL | Status: DC
  Administered 2016-05-23 – 2016-05-24 (×2): 80 mg
  Filled 2016-05-23 (×2): qty 1

## 2016-05-23 MED ORDER — IOPAMIDOL (ISOVUE-300) INJECTION 61%
INTRAVENOUS | Status: AC
  Administered 2016-05-23: 90 mL
  Filled 2016-05-23: qty 100

## 2016-05-23 MED ORDER — PROMETHAZINE HCL 25 MG/ML IJ SOLN
6.2500 mg | INTRAMUSCULAR | Status: DC | PRN
Start: 2016-05-23 — End: 2016-05-23

## 2016-05-23 MED ORDER — PROPOFOL 1000 MG/100ML IV EMUL
0.0000 ug/kg/min | INTRAVENOUS | Status: DC
  Administered 2016-05-23: 25 ug/kg/min via INTRAVENOUS
  Administered 2016-05-23: 50 ug/kg/min via INTRAVENOUS
  Administered 2016-05-23: 40 ug/kg/min via INTRAVENOUS
  Administered 2016-05-24 (×2): 45 ug/kg/min via INTRAVENOUS
  Filled 2016-05-23 (×4): qty 100

## 2016-05-23 MED ORDER — ROCURONIUM BROMIDE 100 MG/10ML IV SOLN
INTRAVENOUS | Status: DC | PRN
  Administered 2016-05-23: 30 mg via INTRAVENOUS
  Administered 2016-05-23: 20 mg via INTRAVENOUS
  Administered 2016-05-23: 10 mg via INTRAVENOUS
  Administered 2016-05-23: 80 mg via INTRAVENOUS

## 2016-05-23 MED ORDER — EPHEDRINE SULFATE 50 MG/ML IJ SOLN
INTRAMUSCULAR | Status: DC | PRN
  Administered 2016-05-23 (×4): 5 mg via INTRAVENOUS

## 2016-05-23 MED ORDER — HEPARIN SODIUM (PORCINE) 1000 UNIT/ML IJ SOLN
INTRAMUSCULAR | Status: DC | PRN
  Administered 2016-05-23: 3000 [IU] via INTRAVENOUS

## 2016-05-23 NOTE — Progress Notes (Addendum)
Patient ID: Jill Taylor, female   DOB: 08-20-1965, 50 y.o.   MRN: [id number] INR. Post procedure CT brain reveals no hemorrhage . Remains intubated.VS stable with BP at 120 mmhg to [id number] mmhg systolic. Will  continue with low dose heparin.. Leave intubated overnight. RT groin sheath  With no bleeding or hematoma. Distal pulses intact. Plan . MRI brain and MRA Brain in am.to see if patient has new ischemic changes. Wean off vent in am. Discussed with patients spouse S. Gemma Ruan MD.

## 2016-05-23 NOTE — Progress Notes (Signed)
CBG @0700  309. Notified Dr. Tobias Alexander. Stated to use diabetic protocol:glucostabilizer and to inform Dr. Estanislado Pandy.  Dr. Dora Sims.  Hedy Camara from pharmacy notified  For insulin. Almyra Free CRNA stated she will start the [place] . 0900 CBG 232.

## 2016-05-23 NOTE — Sedation Documentation (Signed)
Report given to Clarise Cruz, Therapist, sports. Pt to CT scan and then to 3M04 on vent accompanied by CRNA.

## 2016-05-23 NOTE — Anesthesia Postprocedure Evaluation (Signed)
Anesthesia Post Note  Patient: Jill Taylor  Procedure(s) Performed: Procedure(s) (LRB): STENTING (N/A)  Patient location during evaluation: SICU Anesthesia Type: General Level of consciousness: sedated Pain management: pain level controlled Vital Signs Assessment: post-procedure vital signs reviewed and stable Respiratory status: patient remains intubated per anesthesia plan Cardiovascular status: stable Anesthetic complications: no    Last Vitals:  Vitals:   05/23/16 0748 05/23/16 1504  BP: (!) 161/89 (!) 168/75  Pulse: 84 74  Resp: 18 14  Temp: 36.9 C     Last Pain:  Vitals:   05/23/16 0748  TempSrc: Oral                 Ruhaan Nordahl DANIEL

## 2016-05-23 NOTE — Anesthesia Procedure Notes (Signed)
Procedure Name: Intubation Date/Time: 05/23/2016 10:41 AM Performed by: Barrington Ellison Pre-anesthesia Checklist: Patient identified, Emergency Drugs available, Suction available and Patient being monitored Patient Re-evaluated:Patient Re-evaluated prior to inductionOxygen Delivery Method: Circle System Utilized Preoxygenation: Pre-oxygenation with 100% oxygen Intubation Type: IV induction Ventilation: Mask ventilation without difficulty and Two handed mask ventilation required Laryngoscope Size: Mac and 3 Grade View: Grade I Tube type: Oral Tube size: 7.0 mm Number of attempts: 1 Airway Equipment and Method: Stylet and Oral airway Placement Confirmation: ETT inserted through vocal cords under direct vision,  positive ETCO2 and breath sounds checked- equal and bilateral Secured at: 21 cm Tube secured with: Tape Dental Injury: Teeth and Oropharynx as per pre-operative assessment

## 2016-05-23 NOTE — H&P (Signed)
Chief Complaint: (L) MCA stenosis  Referring Physician:Dr. Elson Clan  Supervising Physician: Luanne Bras  Patient Status: [facility] - Out-pt  HPI: Jill Taylor is an 50 y.o. female who is well-known to our service as she has had some recent CVAs.  She had a dx angiogram done that revealed (L) MCA stenosis.  She was set up for intervention in September, but due to an elevated P2Y12 at 189 her procedure was cancelled.  She has subsequently been changed to Brilinta, half a tablet BID.  She states her speech is overall better but when she is tired or stressed is when she begins to have difficulty again.  She presents today for angioplasty/stenting of this area.  Past Medical History:  Past Medical History:  Diagnosis Date  . Anxiety   . Bipolar disorder ([place])   . Brain aneurysm   . COPD (chronic obstructive pulmonary disease) ([place])   . Depression   . 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 Dysrhythmia   . Fibromyalgia   . GERD (gastroesophageal reflux disease)   . Headache   . High cholesterol   . Hypertension    "not anymore"  . IBS (irritable bowel syndrome)   . Neuropathy ([place])   . Pneumonia 2013 ish  . PTSD (post-traumatic stress disorder)   . Shortness of breath dyspnea    with exertion  . Stroke [facility]) 05/2014   05/2014-TIA, TIA and CVA- 7 since 05/2014- memory loss and expressive aphasia. Left sided weakness. "2 in Auguat 2017- hospitalizized  Aug 21- Aug 30, @017  "they said I have weakness on left side, I dont see it."  . UTI (lower urinary tract infection)     Past Surgical History:  Past Surgical History:  Procedure Laterality Date  . ABDOMINAL HYSTERECTOMY    . BRAIN SURGERY  2007  . BRAIN SURGERY    . CHOLECYSTECTOMY    . IR GENERIC HISTORICAL  03/16/2016   IR ANGIO INTRA EXTRACRAN SEL COM CAROTID INNOMINATE BILAT MOD SED 03/16/2016 Luanne Bras, MD [facility]-INTERV RAD  . IR GENERIC HISTORICAL  03/16/2016   IR ANGIO VERTEBRAL SEL  VERTEBRAL UNI R MOD SED 03/16/2016 Luanne Bras, MD [facility]INTER[facility] RAD  . RADIOLOGY WITH ANESTHESIA N/A 02/09/2015   Procedure: ANGIOPLASTY;  Surgeon: Luanne Bras, MD;  Location: [place];  Service: Radiology;  Laterality: N/A;  . TUBAL LIGATION      Family History:  Family History  Problem Relation Age of Onset  . Stroke Paternal Grandmother     Social History:  reports that she has been smoking Cigarettes and E-cigarettes.  She has a 17.50 pack-year smoking history. She has never used smokeless tobacco. She reports that she does not drink alcohol or use drugs.  Allergies:  Allergies  Allergen Reactions  . Other Shortness Of Breath    HICKORY TREES MULBERRY TREES UNSPECIFIED FUNGI, 2 TYPES   . Pine Shortness Of Breath    PINE TREES  . Sulfa Antibiotics Hives    Medications: Medications reviewed in Epic  Please HPI for pertinent positives, otherwise complete 10 system ROS negative.  Mallampati Score: MD Evaluation Airway: WNL Heart: WNL Abdomen: WNL Chest/ Lungs: WNL ASA  Classification: 3  Physical Exam: BP (!) 161/89   Pulse 84   Temp 98.4 F (36.9 C) (Oral)   Resp 18   Wt 172 lb (78 kg)   SpO2 94%   BMI 32.50 kg/m  Body mass index is 32.5 kg/m. General: pleasant, WD,  WN white female who is laying in bed in NAD HEENT: head is normocephalic, atraumatic.  Sclera are noninjected.  PERRL.  Ears and nose without any masses or lesions.  Mouth is pink and moist Heart: regular, rate, and rhythm.  Normal s1,s2. No obvious murmurs, gallops, or rubs noted.  Palpable radial and pedal pulses bilaterally Lungs: CTAB, no wheezes, rhonchi, or rales noted.  Respiratory effort nonlabored Abd: soft, NT, ND, +BS, no masses, hernias, or organomegaly MS: all 4 extremities are symmetrical with no cyanosis, clubbing, or edema. Neuro: normal neuro exam, slight delay in word finding/speech Psych: A&Ox3 with an appropriate affect.   Labs: Results for orders placed or performed  during the hospital encounter of 05/23/16 (from the past 48 hour(s))  Basic metabolic panel     Status: Abnormal   Collection Time: 05/23/16  7:04 AM  Result Value Ref Range   Sodium 136 135 - 145 mmol/L   Potassium 3.8 3.5 - 5.1 mmol/L   Chloride 104 101 - 111 mmol/L   CO2 21 (L) 22 - 32 mmol/L   Glucose, Bld 309 (H) 65 - 99 mg/dL   BUN 7 6 - 20 mg/dL   Creatinine, Ser 0.78 0.44 - 1.00 mg/dL   Calcium 9.3 8.9 - 10.3 mg/dL   GFR calc non Af Amer >60 >60 mL/min   GFR calc Af Amer >60 >60 mL/min    Comment: (NOTE) The eGFR has been calculated using the CKD EPI equation. This calculation has not been validated in all clinical situations. eGFR's persistently <60 mL/min signify possible Chronic Kidney Disease.    Anion gap 11 5 - 15  CBC with Differential/Platelet     Status: Abnormal   Collection Time: 05/23/16  7:04 AM  Result Value Ref Range   WBC 8.5 4.0 - 10.5 K/uL   RBC 5.27 (H) 3.87 - 5.11 MIL/uL   Hemoglobin 14.8 12.0 - 15.0 g/dL   HCT 45.0 36.0 - 46.0 %   MCV 85.4 78.0 - 100.0 fL   MCH 28.1 26.0 - 34.0 pg   MCHC 32.9 30.0 - 36.0 g/dL   RDW 14.3 11.5 - 15.5 %   Platelets 165 150 - 400 K/uL   Neutrophils Relative % 61 %   Neutro Abs 5.2 1.7 - 7.7 K/uL   Lymphocytes Relative 30 %   Lymphs Abs 2.5 0.7 - 4.0 K/uL   Monocytes Relative 6 %   Monocytes Absolute 0.5 0.1 - 1.0 K/uL   Eosinophils Relative 2 %   Eosinophils Absolute 0.2 0.0 - 0.7 K/uL   Basophils Relative 1 %   Basophils Absolute 0.1 0.0 - 0.1 K/uL  Platelet inhibition p2y12 (Not at [facility]     Status: Abnormal   Collection Time: 05/23/16  7:04 AM  Result Value Ref Range   Platelet Function  P2Y12 47 (L) 194 - 418 PRU    Comment:        The literature has shown a direct correlation of PRU values over 230 with higher risks of thrombotic events.  Lower PRU values are associated with platelet inhibition.   Protime-INR     Status: None   Collection Time: 05/23/16  7:04 AM  Result Value Ref Range    Prothrombin Time 11.9 11.4 - 15.2 seconds   INR 0.88   Glucose, capillary     Status: Abnormal   Collection Time: 05/23/16  9:02 AM  Result Value Ref Range   Glucose-Capillary 232 (H) 65 - 99 mg/dL  Imaging: No results found.  Assessment/Plan 1. (L) MCA stenosis -her P2Y12 is 47.  We will proceed with angiogram and possible angioplasty vs stenting of this area. -labs and vitals reviewed -Risks and Benefits discussed with the patient including, but not limited to bleeding, infection, vascular injury or contrast induced renal failure, CVA, death. All of the patient's questions were answered, patient is agreeable to proceed. Consent signed and in chart.   Thank you for this interesting consult.  I greatly enjoyed meeting Jesselle Laflamme and look forward to participating in their care.  A copy of this report was sent to the requesting provider on this date.  Electronically Signed: Henreitta Cea 05/23/2016, 9:14 AM   I spent a total of    25 Minutes in face to face in clinical consultation, greater than 50% of which was counseling/coordinating care for (L) MCA stenosis

## 2016-05-23 NOTE — Procedures (Signed)
Central Venous Catheter Insertion Procedure Note Jill Taylor [id number] 06-02-1966  Procedure: Insertion of Central Venous Catheter Indications: Assessment of intravascular volume, Drug and/or fluid administration and Frequent blood sampling  Procedure Details Consent: Risks of procedure as well as the alternatives and risks of each were explained to the (patient/caregiver).  Consent for procedure obtained. Time Out: Verified patient identification, verified procedure, site/side was marked, verified correct patient position, special equipment/implants available, medications/allergies/relevent history reviewed, required imaging and test results available.  Performed  Maximum sterile technique was used including antiseptics, cap, gloves, gown, hand hygiene, mask and sheet. Skin prep: Chlorhexidine; local anesthetic administered A antimicrobial bonded/coated triple lumen catheter was placed in the right internal jugular vein using the Seldinger technique.  Evaluation Blood flow good Complications: No apparent complications Patient did tolerate procedure well. Chest X-ray ordered to verify placement.  CXR: pending.  Procedure performed under direct ultrasound guidance for real time vessel cannulation.      Montey Hora, [place] [facility] Pager: [phone]  or [phone] 05/23/2016, 4:13 PM   NO acces [place] Vaso active and sedation needs  Lavon Paganini. Titus Mould, MD, [place] Pgr: [facility]

## 2016-05-23 NOTE — Anesthesia Procedure Notes (Signed)
Anesthesia Procedure Image    

## 2016-05-23 NOTE — Consult Note (Signed)
PULMONARY / CRITICAL CARE MEDICINE   Name: Jill Taylor MRN: [id number] DOB: 03-07-1966    ADMISSION DATE:  05/23/2016 CONSULTATION DATE:  05/23/16  REFERRING MD:  Dr. 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 Today she is being admitted after attempt at left MCA angioplasty. The procedure was unsuccessful and CCM was consulted for vent management and hemodynamic instability. There was concern for extension of the stroke, however CT was negative post procedure. Pt arrived on floor sedated on propofol. Sedation was turned down and pt blood pressure returned to normal range. She is intubated with a 7.0 tube. Per respiratory, she has copious secretions.   PAST MEDICAL HISTORY :  She  has a past medical history of Anxiety; Bipolar disorder ([place]); Brain aneurysm; COPD (chronic obstructive pulmonary disease) ([place]); Depression; Diabetes mellitus without complication ([place]); Dysrhythmia; Fibromyalgia; GERD (gastroesophageal reflux disease); Headache; High cholesterol; Hypertension; IBS (irritable bowel syndrome); Neuropathy ([place]); Pneumonia (2013 ish); PTSD (post-traumatic stress disorder); Shortness of breath dyspnea; Stroke [facility]) (05/2014); and UTI (lower urinary tract infection).  PAST SURGICAL HISTORY: She  has a past surgical history that includes Brain surgery (2007); Abdominal hysterectomy; Cholecystectomy; Tubal ligation; Brain surgery; Radiology with anesthesia (N/A, 02/09/2015); ir generic historical (03/16/2016); and ir generic historical (03/16/2016).  Allergies  Allergen Reactions  . Other Shortness Of Breath    HICKORY TREES MULBERRY TREES UNSPECIFIED FUNGI, 2 TYPES   . Pine Shortness Of Breath    PINE TREES  . Sulfa Antibiotics Hives    No current facility-administered medications on file prior to encounter.     Current Outpatient Prescriptions on File Prior to Encounter  Medication Sig  . ACETAMINOPHEN-BUTALBITAL 50-325 MG TABS Take 2 tablets by mouth daily as needed (for headache or migraine).   Marland Kitchen albuterol (PROVENTIL HFA;VENTOLIN HFA) 108 (90 BASE) MCG/ACT inhaler Inhale 2 puffs into the lungs every 6 (six) hours as needed for wheezing or shortness of breath.  Marland Kitchen atorvastatin (LIPITOR) 40 MG tablet Take 2 tablets (80 mg total) by mouth at bedtime.  . busPIRone (BUSPAR) 10 MG tablet Take 10 mg by mouth 2 (two) times daily.   . canagliflozin (INVOKANA) 300 MG TABS tablet Take 300 mg by mouth daily.  . cetirizine (ZYRTEC) 10 MG tablet Take 10 mg by mouth at bedtime.   . clonazePAM (KLONOPIN) 0.5 MG tablet Take 1 tablet (0.5 mg total) by mouth 2 (two) times daily as needed for anxiety. (Patient taking differently: Take 0.5 mg by mouth 2 (two) times daily. )  . Dulaglutide (TRULICITY) 1.5 0000000 SOPN Inject 1.5 mg into the skin every Thursday.   . Fluticasone Furoate-Vilanterol (BREO ELLIPTA) 100-25 MCG/INH AEPB Inhale 2 puffs into the lungs daily.   Marland Kitchen glipiZIDE (GLUCOTROL XL) 10 MG 24 hr tablet Take 10 mg by mouth 2 (two) times daily.  Marland Kitchen omeprazole (PRILOSEC) 40 MG capsule Take 40 mg by mouth 2 (two) times daily.  . polyethylene glycol (MIRALAX / GLYCOLAX) packet Take 17 g by mouth daily as needed for moderate constipation.  . QUEtiapine (SEROQUEL) 25 MG tablet Take 25 mg by mouth 2 (two) times daily.   . traMADol (ULTRAM) 50 MG tablet Take 50 mg by mouth 2 (two) times daily as needed for moderate pain.  Marland Kitchen ipratropium-albuterol (DUONEB) 0.5-2.5 (3) MG/3ML SOLN Take 3 mLs by nebulization every 6 (  six) hours as needed (for shortness of breath).    FAMILY HISTORY:  Her indicated that her mother is deceased. She indicated that her father is deceased. She indicated that her paternal grandmother is deceased.    SOCIAL HISTORY: She  reports that she has been smoking Cigarettes and E-cigarettes.  She has  a 17.50 pack-year smoking history. She has never used smokeless tobacco. She reports that she does not drink alcohol or use drugs.  REVIEW OF SYSTEMS:   Unable to assess, pt sedated.  SUBJECTIVE:  Intubated, sedated, BP >200, cardene drip started. Only 1 peripheral access site. Might require central access.   VITAL SIGNS: BP (!) 168/75   Pulse 74   Temp 98.4 F (36.9 C) (Oral)   Resp 14   Ht 5\' 1"  (1.549 m)   Wt 78 kg (172 lb)   SpO2 100%   BMI 32.50 kg/m   HEMODYNAMICS:    VENTILATOR SETTINGS: Vent Mode: PRVC FiO2 (%):  [100 %] 100 % Set Rate:  [16 bmp] 16 bmp Vt Set:  [400 mL] 400 mL PEEP:  [5 cmH20] 5 cmH20  INTAKE / OUTPUT: No intake/output data recorded.  PHYSICAL EXAMINATION: General:  NAD Neuro:  sedated HEENT:  PEERL 3cm and brisk, [place]/AT, trachea midline, no masses noted Cardiovascular:  RRR no mrg, no peripheral edema, pulses strong Lungs:  Coarse sounds bilaterally on vent Abdomen:  Soft, nontender, nondistended Musculoskeletal:  No clubbing, cyanosis Skin:  Warm, dry, intact   LABS:  BMET  Recent Labs Lab 05/23/16 0704  NA 136  K 3.8  CL 104  CO2 21*  BUN 7  CREATININE 0.78  GLUCOSE 309*    Electrolytes  Recent Labs Lab 05/23/16 0704  CALCIUM 9.3    CBC  Recent Labs Lab 05/23/16 0704  WBC 8.5  HGB 14.8  HCT 45.0  PLT 165    Coag's  Recent Labs Lab 05/23/16 0704  INR 0.88    Sepsis Markers No results for input(s): LATICACIDVEN, PROCALCITON, O2SATVEN in the last 168 hours.  ABG No results for input(s): PHART, PCO2ART, PO2ART in the last 168 hours.  Liver Enzymes No results for input(s): AST, ALT, ALKPHOS, BILITOT, ALBUMIN in the last 168 hours.  Cardiac Enzymes No results for input(s): TROPONINI, PROBNP in the last 168 hours.  Glucose  Recent Labs Lab 05/23/16 0902 05/23/16 1102 05/23/16 1204 05/23/16 1259  GLUCAP 232* 217* 179* 143*    Imaging Ct Head Wo Contrast  Result Date: 05/23/2016 CLINICAL  DATA:  Blood pressure disturbance following interventional procedure.Previous aneurysm clipping on the right. Left cerebral vascular intervention today. EXAM: CT HEAD WITHOUT CONTRAST TECHNIQUE: Contiguous axial images were obtained from the base of the skull through the vertex without intravenous contrast. COMPARISON:  Head CT 03/05/2016. FINDINGS: Brain: Previous right pterional craniotomy for aneurysm clipping. No sign of infarction on the right. Old infarction in the left basal ganglia and left posterior frontal cortical and subcortical brain. No sign of acute infarction, mass lesion, hemorrhage, hydrocephalus or extra-axial collection. Vascular: No acute vascular finding. Skull: Postoperative changes on the right.  Otherwise negative. Sinuses/Orbits: Scattered opacified ethmoid air cells. Other: None significant. IMPRESSION: No acute finding by CT. 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 Old infarction left basal ganglia and left posterior frontal cortical and subcortical brain. Electronically Signed   By: Nelson Chimes M.D.   On: 05/23/2016 14:50     STUDIES:  CT head 11/8 >>> No acute finding by CT. Remote aneurysm clipping on the  right. Old infarction left basal ganglia and left posterior frontal cortical and subcortical brain  CULTURES:   ANTIBIOTICS:   SIGNIFICANT EVENTS: Lt MCA angioplasty attempt 11/8  LINES/TUBES: ETT 11/8  DISCUSSION: 50 yo female s/p failed attempt at L MCA angioplasty. On vent. Sedated.  ASSESSMENT / PLAN:  PULMONARY A: VDRF- intubated for angioplasty  COPD per chart- no PFTs in system P:   Full vent support Wean tomorrow Duonebs, budesonide, albuterol prn  CARDIOVASCULAR A:  Hypertension  P:  Cardene drip, goal SBP 120-150, prevent SBP < 120 for perfusion Continue home meds Defer resuming Brilinta to neurology  Continue ASA, statin,   RENAL A:   No acute issues at this time P:   AM BMP  GASTROINTESTINAL A:   PPx P:   NPO SLP when  extubated pepcid for ppx  HEMATOLOGIC A:   No acute changes P:  CBC in AM  INFECTIOUS A:   No acute issues P:     ENDOCRINE A:   Insulin dependent diabetes   P:   SSI  NEUROLOGIC A:   S/p unsuccessful attempt at left MCA angioplasty Sedated P:   RASS goal: 0/-1 Propofol, prn fentanyl Stroke management per neurology   FAMILY  - Updates: no family present at bedside  - Inter-disciplinary family meet or Palliative Care meeting due by: 05/30/16  Quin Hoop, MS4 Attending Dr Titus Mould  Pulmonary and [place] Pager: [phone]  05/23/2016, 3:27 PM   STAFF NOTE: Linwood Dibbles, MD FACP have personally reviewed patient's available data, including medical history, events of note, physical examination and test results as part of my evaluation. I have discussed with resident/NP and other care providers such as pharmacist, RN and RRT. In addition, I personally evaluated patient and elicited key findings of: sesated on propofol post IR, vent, lungs have ronchi, perrl, failed attempts noted at MCA intervention, has had labile BP response, will assess abg on 8 cc/kg, rate at normal MV, 50% peep5, get pcxr unclear why she has ronchi, assess ecg, secretions are not significant, may need trop, repeat CT noted wihtout new CVA or extension, antiplat per neuro IR, sys goals noted, we need to add back nicardipine now given BP now on propofol, given hemodynamic changes will remain intubated overnight, may need echo assessment if we see pulm edema on pxr, has lost access, place line, consider cvp assessment as it relates to BP and volume status, no feeding as likley to wean aggressive in am, , pcxr also for ett, ppi/pepcid while on vent The patient is critically ill with multiple organ systems failure and requires high complexity decision making for assessment and support, frequent evaluation and titration of therapies, application of advanced  monitoring technologies and extensive interpretation of multiple databases.   Critical Care Time devoted to patient care services described in this note is 35 Minutes. This time reflects time of care of this signee: Merrie Roof, MD FACP. This critical care time does not reflect procedure time, or teaching time or supervisory time of PA/NP/Med student/Med Resident etc but could involve care discussion time. Rest per NP/medical resident whose note is outlined above and that I agree with   Lavon Paganini. Titus Mould, MD, [place] Pgr: [facility] 05/23/2016 4:56 PM

## 2016-05-23 NOTE — Consult Note (Signed)
Neurology Consultation Reason for Consult: MCA stenosis Referring Physician: Estanislado Pandy, T  CC: Recurrent episodes of aphasia  History is obtained from: Chart review, family  HPI: Jill Taylor is a 50 y.o. female with a history of recurrent episodes of aphasia who had angioplasty of her left MCA in July 2016. In August 2017, she was readmitted with unknown last known well with fluctuating leg which difficulties. Showed a patchy left MCA infarct. She had an outpatient catheter angiogram in September which showed restenosis of the left MCA. She had Plavix switched ticagrelor.   Due to her recurrent symptoms, she was planning to have a repeat angioplasty or stent done however during the procedure today, it turned out to be very difficult across the lesion. After multiple attempts, there was concern for possible worsening stenosis and on repeat study it appeared to have cleared. Given this, it was decided to abort the procedure and due to the concern for possible unstable plaque she was started on low-dose heparin.  LKW: 10:40 AM   ROS: Unable to obtain due to patient's intubation/sedation   Past Medical History:  Diagnosis Date  . Anxiety   . Bipolar disorder ([place])   . Brain aneurysm   . COPD (chronic obstructive pulmonary disease) ([place])   . Depression   . Diabetes mellitus without complication ([place])    Tytpe II  . Dysrhythmia   . Fibromyalgia   . GERD (gastroesophageal reflux disease)   . Headache   . High cholesterol   . Hypertension    "not anymore"  . IBS (irritable bowel syndrome)   . Neuropathy ([place])   . Pneumonia 2013 ish  . PTSD (post-traumatic stress disorder)   . Shortness of breath dyspnea    with exertion  . Stroke [facility]) 05/2014   05/2014-TIA, TIA and CVA- 7 since 05/2014- memory loss and expressive aphasia. Left sided weakness. "2 in Auguat 2017- hospitalizized  Aug 21- Aug 30, @017  "they said I have weakness on left side, I dont see it."  . UTI (lower urinary  tract infection)      Family History  Problem Relation Age of Onset  . Stroke Paternal Grandmother      Social History:  reports that she has been smoking Cigarettes and E-cigarettes.  She has a 17.50 pack-year smoking history. She has never used smokeless tobacco. She reports that she does not drink alcohol or use drugs.   Exam: Current vital signs: BP (!) 159/85   Pulse 78   Temp 98.1 F (36.7 C) (Axillary)   Resp 15   Ht 5\' 1"  (1.549 m)   Wt 78 kg (172 lb)   SpO2 100%   BMI 32.50 kg/m  Vital signs in last 24 hours: Temp:  [98.1 F (36.7 C)-98.4 F (36.9 C)] 98.1 F (36.7 C) (11/08 1700) Pulse Rate:  [74-106] 78 (11/08 1800) Resp:  [14-29] 15 (11/08 1800) BP: (117-191)/(51-133) 159/85 (11/08 1800) SpO2:  [89 %-100 %] 100 % (11/08 1800) Arterial Line BP: (113-207)/(51-88) 157/76 (11/08 1800) FiO2 (%):  [50 %-100 %] 50 % (11/08 1520) Weight:  [78 kg (172 lb)] 78 kg (172 lb) (11/08 0748)   Physical Exam  Constitutional: Appears older than stated age Psych: Affect appropriate to situation Eyes: No scleral injection HENT: ET tube in place Head: Normocephalic.  Cardiovascular: Normal rate and regular rhythm.  Respiratory: Ventilated GI: Soft.  No distension. There is no tenderness.  Skin: WDI  Neuro: Mental Status: With sedation balls, patient does follow commands in all  4 extremities and nod/shake head Cranial Nerves: II: Endorses ability to see fingers wiggling bilaterally Pupils are very mildly unequal, with the right smaller than the left, less than 1 mm difference III,IV, VI: EOMI without ptosis or diploplia.  V: Facial sensation is symmetric to temperature VII: Facial movement is symmetric.  VIII: hearing is intact to voice X: Uvula elevates symmetrically XI: Shoulder shrug is symmetric. XII: tongue is midline without atrophy or fasciculations.  Motor: Tone is normal. Bulk is normal. She may have a very mild right arm weakness but this is difficult to  tell due to her sedation. Unable to fully test right leg due to sheet in place, but she does follow commands with it. 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 Agree with MRI/MRA  Recommendations: 1) agree with MRI/MRA 2) I would continue dual antiplatelet therapy 3) would continue working with her PCP to ensure adequate diabetes control, cholesterol control 4) I indicated to her family the importance of smoking cessation 5) stroke team to follow    Roland Rack, MD [facility] [phone]  If 7pm- 7am, please page neurology on call as listed in [place].

## 2016-05-23 NOTE — Transfer of Care (Signed)
Immediate Anesthesia Transfer of Care Note  Patient: Jill Taylor  Procedure(s) Performed: Procedure(s): STENTING (N/A)  Patient Location: ICU  Anesthesia Type:General  Level of Consciousness: Patient remains intubated per anesthesia plan  Airway & Oxygen Therapy: Patient remains intubated per anesthesia plan and Patient placed on Ventilator (see vital sign flow sheet for setting)  Post-op Assessment: Report given to RN  Post vital signs: Reviewed and stable  Last Vitals:  Vitals:   05/23/16 0748  BP: (!) 161/89  Pulse: 84  Resp: 18  Temp: 36.9 C    Last Pain:  Vitals:   05/23/16 0748  TempSrc: Oral      Patients Stated Pain Goal: 4 ([id number])  Complications: No apparent anesthesia complications

## 2016-05-23 NOTE — Procedures (Signed)
S/P Ltb common carotid arteriogram,followed by unsuccessful attempt  At LT MCA angioplasty.

## 2016-05-23 NOTE — Sedation Documentation (Signed)
Pt transported to 3M04 with anesthesia.   Brief update at bedside.

## 2016-05-23 NOTE — Progress Notes (Signed)
ANTICOAGULATION CONSULT NOTE - Initial Consult  Pharmacy Consult for Heparin Indication: IR procedure (MCA stenting)  Allergies  Allergen Reactions  . Other Shortness Of Breath    HICKORY TREES MULBERRY TREES UNSPECIFIED FUNGI, 2 TYPES   . Pine Shortness Of Breath    PINE TREES  . Sulfa Antibiotics Hives    Patient Measurements: Height: 5\' 1"  (154.9 cm) Weight: 172 lb (78 kg) IBW/kg (Calculated) : 47.8 Heparin Dosing Weight: ~68 kg  Vital Signs: Temp: 98.4 F (36.9 C) (11/08 0748) Temp Source: Oral (11/08 0748) BP: 168/75 (11/08 1504) Pulse Rate: 74 (11/08 1504)  Labs:  Recent Labs  05/23/16 0704  HGB 14.8  HCT 45.0  PLT 165  LABPROT 11.9  INR 0.88  CREATININE 0.78    Estimated Creatinine Clearance: 80.4 mL/min (by C-G formula based on SCr of 0.78 mg/dL).   Medical History: Past Medical History:  Diagnosis Date  . Anxiety   . Bipolar disorder ([place])   . Brain aneurysm   . COPD (chronic obstructive pulmonary disease) ([place])   . Depression   . Diabetes mellitus without complication ([place])    Tytpe II  . Dysrhythmia   . Fibromyalgia   . GERD (gastroesophageal reflux disease)   . Headache   . High cholesterol   . Hypertension    "not anymore"  . IBS (irritable bowel syndrome)   . Neuropathy ([place])   . Pneumonia 2013 ish  . PTSD (post-traumatic stress disorder)   . Shortness of breath dyspnea    with exertion  . Stroke [facility]) 05/2014   05/2014-TIA, TIA and CVA- 7 since 05/2014- memory loss and expressive aphasia. Left sided weakness. "2 in Auguat 2017- hospitalizized  Aug 21- Aug 30, @017  "they said I have weakness on left side, I dont see it."  . UTI (lower urinary tract infection)     Medications:  Infusions:  . sodium chloride 75 mL/hr at 05/23/16 1526  . heparin    . niCARDipine 15 mg/hr (05/23/16 1526)  . propofol (DIPRIVAN) infusion 25 mcg/kg/min (05/23/16 1514)    Assessment: 50 yo female s/p MCA IR intervention.  Pharmacy asked to start  IV heparin overnight, to be turned off at 7 AM for sheath removal.  Baseline CBC WNL.  Goal of Therapy:  Heparin level 0.1-0.25 units/ml Monitor platelets by anticoagulation protocol: Yes   Plan:  1. Start IV heparin at 650 units/hr. 2. Check heparin level in 6 hrs. 3. Heparin gtt off at 7 AM.  [place] Crane, Vena [place], BCPS  Clinical Pharmacist Pager [phone]  05/23/2016 3:37 PM

## 2016-05-23 NOTE — Anesthesia Procedure Notes (Deleted)
Procedures

## 2016-05-24 ENCOUNTER — Inpatient Hospital Stay (HOSPITAL_COMMUNITY): Payer: Medicare Other

## 2016-05-24 ENCOUNTER — Encounter (HOSPITAL_COMMUNITY): Payer: Self-pay | Admitting: Interventional Radiology

## 2016-05-24 DIAGNOSIS — I10 Essential (primary) hypertension: Secondary | ICD-10-CM

## 2016-05-24 DIAGNOSIS — I6602 Occlusion and stenosis of left middle cerebral artery: Secondary | ICD-10-CM

## 2016-05-24 DIAGNOSIS — E785 Hyperlipidemia, unspecified: Secondary | ICD-10-CM

## 2016-05-24 DIAGNOSIS — I63512 Cerebral infarction due to unspecified occlusion or stenosis of left middle cerebral artery: Principal | ICD-10-CM

## 2016-05-24 DIAGNOSIS — E1159 Type 2 diabetes mellitus with other circulatory complications: Secondary | ICD-10-CM

## 2016-05-24 DIAGNOSIS — J9600 Acute respiratory failure, unspecified whether with hypoxia or hypercapnia: Secondary | ICD-10-CM

## 2016-05-24 DIAGNOSIS — Q251 Coarctation of aorta: Secondary | ICD-10-CM

## 2016-05-24 LAB — CBC WITH DIFFERENTIAL/PLATELET
Basophils Absolute: 0 10*3/uL (ref 0.0–0.1)
Basophils Relative: 0 %
EOS ABS: 0.1 10*3/uL (ref 0.0–0.7)
Eosinophils Relative: 1 %
HEMATOCRIT: 37.2 % (ref 36.0–46.0)
HEMOGLOBIN: 11.9 g/dL — AB (ref 12.0–15.0)
LYMPHS ABS: 3.4 10*3/uL (ref 0.7–4.0)
Lymphocytes Relative: 26 %
MCH: 27.4 pg (ref 26.0–34.0)
MCHC: 32 g/dL (ref 30.0–36.0)
MCV: 85.7 fL (ref 78.0–100.0)
Monocytes Absolute: 0.6 10*3/uL (ref 0.1–1.0)
Monocytes Relative: 4 %
NEUTROS ABS: 8.8 10*3/uL — AB (ref 1.7–7.7)
NEUTROS PCT: 69 %
Platelets: 146 10*3/uL — ABNORMAL LOW (ref 150–400)
RBC: 4.34 MIL/uL (ref 3.87–5.11)
RDW: 14.5 % (ref 11.5–15.5)
WBC: 13 10*3/uL — AB (ref 4.0–10.5)

## 2016-05-24 LAB — GLUCOSE, CAPILLARY
GLUCOSE-CAPILLARY: 113 mg/dL — AB (ref 65–99)
GLUCOSE-CAPILLARY: 124 mg/dL — AB (ref 65–99)
GLUCOSE-CAPILLARY: 143 mg/dL — AB (ref 65–99)
Glucose-Capillary: 115 mg/dL — ABNORMAL HIGH (ref 65–99)

## 2016-05-24 LAB — BLOOD GAS, ARTERIAL
Acid-base deficit: 1.7 mmol/L (ref 0.0–2.0)
Bicarbonate: 22.3 mmol/L (ref 20.0–28.0)
Drawn by: 244871
FIO2: 50
MECHVT: 400 mL
O2 Saturation: 98.6 %
PEEP: 5 cmH2O
Patient temperature: 98.6
RATE: 20 {breaths}/min
pCO2 arterial: 35.9 mmHg (ref 32.0–48.0)
pH, Arterial: 7.409 (ref 7.350–7.450)
pO2, Arterial: 181 mmHg — ABNORMAL HIGH (ref 83.0–108.0)

## 2016-05-24 LAB — TSH: TSH: 1.907 u[IU]/mL (ref 0.350–4.500)

## 2016-05-24 LAB — BASIC METABOLIC PANEL
Anion gap: 6 (ref 5–15)
CALCIUM: 7.9 mg/dL — AB (ref 8.9–10.3)
CO2: 22 mmol/L (ref 22–32)
CREATININE: 0.67 mg/dL (ref 0.44–1.00)
Chloride: 112 mmol/L — ABNORMAL HIGH (ref 101–111)
GFR calc Af Amer: >60 mL/min (ref >60)
GLUCOSE: 131 mg/dL — AB (ref 65–99)
POTASSIUM: 3.9 mmol/L (ref 3.5–5.1)
SODIUM: 140 mmol/L (ref 135–145)

## 2016-05-24 LAB — LIPID PANEL
Cholesterol: 108 mg/dL (ref 0–200)
HDL: 42 mg/dL
LDL Cholesterol: 44 mg/dL (ref 0–99)
Total CHOL/HDL Ratio: 2.6 ratio
Triglycerides: 111 mg/dL
VLDL: 22 mg/dL (ref 0–40)

## 2016-05-24 LAB — VITAMIN B12: VITAMIN B 12: 469 pg/mL (ref 180–914)

## 2016-05-24 LAB — BASIC METABOLIC PANEL WITH GFR
Anion gap: 10 (ref 5–15)
BUN: <5 mg/dL — ABNORMAL LOW (ref 6–20)
CO2: 21 mmol/L — ABNORMAL LOW (ref 22–32)
Calcium: 8.1 mg/dL — ABNORMAL LOW (ref 8.9–10.3)
Chloride: 110 mmol/L (ref 101–111)
Creatinine, Ser: 0.75 mg/dL (ref 0.44–1.00)
GFR calc Af Amer: >60 mL/min
GFR calc non Af Amer: >60 mL/min
Glucose, Bld: 133 mg/dL — ABNORMAL HIGH (ref 65–99)
Potassium: 3 mmol/L — ABNORMAL LOW (ref 3.5–5.1)
Sodium: 141 mmol/L (ref 135–145)

## 2016-05-24 LAB — PHOSPHORUS: PHOSPHORUS: 2.7 mg/dL (ref 2.5–4.6)

## 2016-05-24 LAB — MAGNESIUM: MAGNESIUM: 1.9 mg/dL (ref 1.7–2.4)

## 2016-05-24 MED ORDER — TICAGRELOR 90 MG PO TABS
45.0000 mg | ORAL_TABLET | Freq: Two times a day (BID) | ORAL | Status: DC
  Administered 2016-05-24 – 2016-05-25 (×2): 45 mg via ORAL
  Filled 2016-05-24 (×2): qty 1

## 2016-05-24 MED ORDER — INSULIN ASPART 100 UNIT/ML ~~LOC~~ SOLN
0.0000 [IU] | Freq: Three times a day (TID) | SUBCUTANEOUS | Status: DC
  Administered 2016-05-25: 4 [IU] via SUBCUTANEOUS

## 2016-05-24 MED ORDER — INSULIN ASPART 100 UNIT/ML ~~LOC~~ SOLN
0.0000 [IU] | Freq: Every day | SUBCUTANEOUS | Status: DC
  Administered 2016-05-25: 3 [IU] via SUBCUTANEOUS

## 2016-05-24 MED ORDER — INSULIN ASPART 100 UNIT/ML ~~LOC~~ SOLN: 0.0000 [IU] | Freq: Three times a day (TID) | SUBCUTANEOUS | Status: DC

## 2016-05-24 MED ORDER — TICAGRELOR 90 MG PO TABS
90.0000 mg | ORAL_TABLET | Freq: Once | ORAL | Status: AC
  Administered 2016-05-24: 90 mg via ORAL
  Filled 2016-05-24: qty 1

## 2016-05-24 MED ORDER — SODIUM CHLORIDE 0.9 % IV SOLN
30.0000 meq | Freq: Once | INTRAVENOUS | Status: AC
  Administered 2016-05-24: 30 meq via INTRAVENOUS
  Filled 2016-05-24: qty 15

## 2016-05-24 MED ORDER — TICAGRELOR 90 MG PO TABS: 90.0000 mg | ORAL_TABLET | Freq: Two times a day (BID) | ORAL | Status: DC

## 2016-05-24 NOTE — Progress Notes (Signed)
Checked on patient again this afternoon.  She is in a wheelchair heading down for her MRA.  She has been extubated and has no complaints.  She is A&Ox3.  She answers questions appropriately with only a slight delay in time for her response.  PE: Neuro: grossly intact.  Good upper extremity grip strength  A/P 1. Will await MRA of her brain to further assess if she has had any new CVAs -cont current care for now and make further decisions after MRA completed.  Scherry Laverne E 05/24/2016

## 2016-05-24 NOTE — Progress Notes (Signed)
Referring Physician(s): Dr Rosalin Hawking  Supervising Physician: Luanne Bras  Patient Status:  [facility] - In-pt  Chief Complaint:  L Middle cerebral artery stenosis   Subjective:  05/23/16: CEREBRAL ANGIOGRAM [id number] (Custom)]       S/P Ltb common carotid arteriogram,followed by unsuccessful attempt  At LT MCA angioplasty.     Intubated Agitated Moving all 4s Follows commands Plan for extubation; MRI/MRA today    Allergies: Other; Pine; and Sulfa antibiotics  Medications: Prior to Admission medications   Medication Sig Start Date End Date Taking? Authorizing Provider  ACETAMINOPHEN-BUTALBITAL 50-325 MG TABS Take 2 tablets by mouth daily as needed (for headache or migraine).    Yes Historical Provider, MD  albuterol (PROVENTIL HFA;VENTOLIN HFA) 108 (90 BASE) MCG/ACT inhaler Inhale 2 puffs into the lungs every 6 (six) hours as needed for wheezing or shortness of breath.   Yes Historical Provider, MD  aspirin EC 81 MG tablet Take 81 mg by mouth daily.   Yes Historical Provider, MD  atorvastatin (LIPITOR) 40 MG tablet Take 2 tablets (80 mg total) by mouth at bedtime. 03/07/16  Yes Everrett Coombe, MD  busPIRone (BUSPAR) 10 MG tablet Take 10 mg by mouth 2 (two) times daily.    Yes Historical Provider, MD  canagliflozin (INVOKANA) 300 MG TABS tablet Take 300 mg by mouth daily.   Yes Historical Provider, MD  cetirizine (ZYRTEC) 10 MG tablet Take 10 mg by mouth at bedtime.  01/11/15  Yes Historical Provider, MD  clonazePAM (KLONOPIN) 0.5 MG tablet Take 1 tablet (0.5 mg total) by mouth 2 (two) times daily as needed for anxiety. Patient taking differently: Take 0.5 mg by mouth 2 (two) times daily.  03/07/16  Yes Everrett Coombe, MD  Dulaglutide (TRULICITY) 1.5 0000000 SOPN Inject 1.5 mg into the skin every Thursday.    Yes Historical Provider, MD  Fluticasone Furoate-Vilanterol (BREO ELLIPTA) 100-25 MCG/INH AEPB Inhale 2 puffs into the lungs daily.    Yes Historical Provider, MD    Gabapentin Enacarbil 600 MG TBCR Take 600 mg by mouth at bedtime.    Yes Historical Provider, MD  glipiZIDE (GLUCOTROL XL) 10 MG 24 hr tablet Take 10 mg by mouth 2 (two) times daily.   Yes Historical Provider, MD  HYDROcodone-acetaminophen (NORCO/VICODIN) 5-325 MG tablet Take 0.5-1 tablets by mouth daily as needed (for migraine headache pain not relieved by Butalbital and acetaminophen).   Yes Historical Provider, MD  omeprazole (PRILOSEC) 40 MG capsule Take 40 mg by mouth 2 (two) times daily. 01/11/15  Yes Historical Provider, MD  polyethylene glycol (MIRALAX / GLYCOLAX) packet Take 17 g by mouth daily as needed for moderate constipation.   Yes Historical Provider, MD  QUEtiapine (SEROQUEL) 25 MG tablet Take 25 mg by mouth 2 (two) times daily.    Yes Historical Provider, MD  ticagrelor (BRILINTA) 90 MG TABS tablet Take 45 mg by mouth 2 (two) times daily.    Yes Historical Provider, MD  traMADol (ULTRAM) 50 MG tablet Take 50 mg by mouth 2 (two) times daily as needed for moderate pain.   Yes Historical Provider, MD  ipratropium-albuterol (DUONEB) 0.5-2.5 (3) MG/3ML SOLN Take 3 mLs by nebulization every 6 (six) hours as needed (for shortness of breath).    Historical Provider, MD  loperamide (IMODIUM) 2 MG capsule Take by mouth as needed for diarrhea or loose stools.    Historical Provider, MD     Vital Signs: BP (!) 147/66   Pulse 80   Temp 98.8  F (37.1 C) (Axillary)   Resp 20   Ht 5\' 1"  (1.549 m)   Wt 172 lb (78 kg)   SpO2 99%   BMI 32.50 kg/m   Physical Exam  HENT:  Intubated Opens eyes to command   Musculoskeletal: Normal range of motion.  Grips B to command Minimally weaker on Rt Moving B legs well; to command  Neurological: She is alert.  Skin: Skin is warm and dry.  Rt groin clean and dry NT no bleeding No hematoma Rt foot 2+ pulses Sheath intact  Nursing note and vitals reviewed.   Imaging: Dg Abd 1 View  Result Date: 05/23/2016 CLINICAL DATA:  OG tube placement  EXAM: ABDOMEN - 1 VIEW COMPARISON:  11/05/2014 FINDINGS: Bowel gas pattern within normal limits. Surgical clips in the right upper quadrant. Esophageal tube tip over lies the body of the stomach. IMPRESSION: Esophageal tube tip overlies the body of the stomach. Electronically Signed   By: Donavan Foil M.D.   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 Left cerebral vascular intervention today. EXAM: CT HEAD WITHOUT CONTRAST TECHNIQUE: Contiguous axial images were obtained from the base of the skull through the vertex without intravenous contrast. COMPARISON:  Head CT 03/05/2016. FINDINGS: Brain: Previous right pterional craniotomy for aneurysm clipping. No sign of infarction on the right. Old infarction in the left basal ganglia and left posterior frontal cortical and subcortical brain. No sign of acute infarction, mass lesion, hemorrhage, hydrocephalus or extra-axial collection. Vascular: No acute vascular finding. Skull: Postoperative changes on the right.  Otherwise negative. Sinuses/Orbits: Scattered opacified ethmoid air cells. Other: None significant. IMPRESSION: No acute finding by CT. 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 Old infarction left basal ganglia and left posterior frontal cortical and subcortical brain. Electronically Signed   By: Nelson Chimes M.D.   On: 05/23/2016 14:50   Portable Chest Xray  Result Date: 05/24/2016 CLINICAL DATA:  Respiratory failure. EXAM: PORTABLE CHEST 1 VIEW COMPARISON:  05/23/2016 FINDINGS: Endotracheal tube remains with the tip approximately 3 cm above the carina. Right jugular central line shows stable positioning with the catheter tip at the SVC/ RA junction. Gastric decompression tube extends into the stomach. Lungs show low volumes without focal consolidation, edema or pneumothorax. No significant pleural fluid  identified. The heart size remains normal. IMPRESSION: Low lung volumes without focal airspace consolidation. Electronically Signed   By: Aletta Edouard M.D.   On: 05/24/2016 08:06   Dg Chest Port 1 View  Result Date: 05/23/2016 CLINICAL DATA:  Central line placement. EXAM: PORTABLE CHEST 1 VIEW COMPARISON:  02/16/2015 FINDINGS: The patient is intubated. Endotracheal tube terminates 3 cm above the carina. Enteric catheter terminates within the stomach. Right internal jugular approach central venous catheter terminates at the cavoatrial junction. Cardiomediastinal silhouette is normal. Mediastinal contours appear intact. There is no evidence of focal airspace consolidation, pleural effusion or pneumothorax. Low lung volumes. Osseous structures are without acute abnormality. Soft tissues are grossly normal. IMPRESSION: Low lung volumes.  No evidence of pneumothorax. Support apparatus as described. Electronically Signed   By: Fidela Salisbury M.D.   On: 05/23/2016 17:13    Labs:  CBC:  Recent Labs  03/16/16 0645 03/28/16 0708 05/23/16 0704 05/24/16 0325  WBC 9.0 8.5 8.5 13.0*  HGB 14.2 13.5 14.8 11.9*  HCT 44.7 42.5 45.0 37.2  PLT 175 152 165 146*    COAGS:  Recent Labs  06/02/15 0733 03/05/16 1308 03/16/16 0645 03/28/16 0708 05/23/16 0704  INR 0.95 0.96 0.92 1.00 0.88  APTT 24 24 20* 24  --     BMP:  Recent Labs  03/16/16 0645 03/28/16 0708 05/23/16 0704 05/24/16 0325  NA 136 137 136 141  K 4.4 4.5 3.8 3.0*  CL 102 103 104 110  CO2 26 25 21* 21*  GLUCOSE 219* 204* 309* 133*  BUN 13 10 7  <5*  CALCIUM 9.4 9.2 9.3 8.1*  CREATININE 0.82 0.89 0.78 0.75  GFRNONAA >60 >60 >60 >60  GFRAA >60 >60 >60 >60    LIVER FUNCTION TESTS:  Recent Labs  03/05/16 1308  BILITOT 0.9  AST 20  ALT 22  ALKPHOS 145*  PROT 7.0  ALBUMIN 4.0    Assessment and Plan:  L MCA stenosis Attempted angioplasty 11/8 in IR Could not perform ---could not get across per Dr  Estanislado Pandy Plan to extubate today Remove Rt groin sheath today Re assess  Electronically Signed: Shira Bobst A 05/24/2016, 8:35 AM   I spent a total of 15 Minutes at the the patient's bedside AND on the patient's hospital floor or unit, greater than 50% of which was counseling/coordinating care for L MCA stenosis

## 2016-05-24 NOTE — Consult Note (Signed)
PULMONARY / CRITICAL CARE MEDICINE   Name: Jill Taylor MRN: [id number] DOB: September 06, 1965    ADMISSION DATE:  05/23/2016 CONSULTATION DATE:  05/23/16  REFERRING MD:  Dr. 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 Today she is being admitted after attempt at left MCA angioplasty. The procedure was unsuccessful and CCM was consulted for vent management and hemodynamic instability. There was concern for extension of the stroke, however CT was negative post procedure. Pt arrived on floor sedated on propofol. Sedation was turned down and pt blood pressure returned to normal range. She is intubated with a 7.0 tube. Per respiratory, she has copious secretions.   SUBJECTIVE:  Remained on vent On and off nicardipine Line placed  VITAL SIGNS: BP (!) 147/66   Pulse 80   Temp 98.8 F (37.1 C) (Axillary)   Resp 20   Ht 5\' 1"  (1.549 m)   Wt 78 kg (172 lb)   SpO2 99%   BMI 32.50 kg/m   HEMODYNAMICS: CVP:  [6 mmHg-11 mmHg] 7 mmHg  VENTILATOR SETTINGS: Vent Mode: PRVC FiO2 (%):  [50 %-100 %] 50 % Set Rate:  [15 bmp-20 bmp] 20 bmp Vt Set:  [400 mL] 400 mL PEEP:  [5 cmH20] 5 cmH20 Plateau Pressure:  [16 cmH20] 16 cmH20  INTAKE / OUTPUT: I/O last 3 completed shifts: In: 3477.8 [I.V.:3162.8; IV Piggyback:315] Out: 2150 [Urine:2100; Blood:50]  PHYSICAL EXAMINATION: General:  agitation Neuro:  Sedated rass 2, FC well HEENT:  PEERL 3cm, ett Cardiovascular:  s1 s2  RRR Lungs:  Coarse sounds improved Abdomen:  Soft, nontender, nondistended Musculoskeletal:  No clubbing, cyanosis Skin:  Warm, dry, intact   LABS:  BMET  Recent Labs Lab 05/23/16 0704 05/24/16 0325  NA 136 141  K 3.8 3.0*  CL 104 110  CO2 21* 21*  BUN 7 <5*  CREATININE 0.78 0.75  GLUCOSE 309* 133*    Electrolytes  Recent Labs Lab  05/23/16 0704 05/24/16 0325  CALCIUM 9.3 8.1*  MG  --  1.9  PHOS  --  2.7    CBC  Recent Labs Lab 05/23/16 0704 05/24/16 0325  WBC 8.5 13.0*  HGB 14.8 11.9*  HCT 45.0 37.2  PLT 165 146*    Coag's  Recent Labs Lab 05/23/16 0704  INR 0.88    Sepsis Markers No results for input(s): LATICACIDVEN, PROCALCITON, O2SATVEN in the last 168 hours.  ABG  Recent Labs Lab 05/23/16 1530 05/24/16 0325  PHART 7.300* 7.409  PCO2ART 44.9 35.9  PO2ART 119* 181*    Liver Enzymes No results for input(s): AST, ALT, ALKPHOS, BILITOT, ALBUMIN in the last 168 hours.  Cardiac Enzymes No results for input(s): TROPONINI, PROBNP in the last 168 hours.  Glucose  Recent Labs Lab 05/23/16 1102 05/23/16 1204 05/23/16 1259 05/23/16 1516 05/23/16 1953 05/24/16 0010  GLUCAP 217* 179* 143* 149* 152* 143*    Imaging Dg Abd 1 View  Result Date: 05/23/2016 CLINICAL DATA:  OG tube placement EXAM: ABDOMEN - 1 VIEW COMPARISON:  11/05/2014 FINDINGS: Bowel gas pattern within normal limits. Surgical clips in the right upper quadrant. Esophageal tube tip over lies the body of the stomach. IMPRESSION: Esophageal tube tip overlies the body of the stomach. Electronically Signed   By: Donavan Foil M.D.   On: 05/23/2016 17:13   Ct Head  Wo Contrast  Result Date: 05/23/2016 CLINICAL DATA:  Blood pressure disturbance following interventional procedure.Previous aneurysm clipping on the right. Left cerebral vascular intervention today. EXAM: CT HEAD WITHOUT CONTRAST TECHNIQUE: Contiguous axial images were obtained from the base of the skull through the vertex without intravenous contrast. COMPARISON:  Head CT 03/05/2016. FINDINGS: Brain: Previous right pterional craniotomy for aneurysm clipping. No sign of infarction on the right. Old infarction in the left basal ganglia and left posterior frontal cortical and subcortical brain. No sign of acute infarction, mass lesion, hemorrhage, hydrocephalus or  extra-axial collection. Vascular: No acute vascular finding. Skull: Postoperative changes on the right.  Otherwise negative. Sinuses/Orbits: Scattered opacified ethmoid air cells. Other: None significant. IMPRESSION: No acute finding by CT. 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 Old infarction left basal ganglia and left posterior frontal cortical and subcortical brain. Electronically Signed   By: Nelson Chimes M.D.   On: 05/23/2016 14:50   Portable Chest Xray  Result Date: 05/24/2016 CLINICAL DATA:  Respiratory failure. EXAM: PORTABLE CHEST 1 VIEW COMPARISON:  05/23/2016 FINDINGS: Endotracheal tube remains with the tip approximately 3 cm above the carina. Right jugular central line shows stable positioning with the catheter tip at the SVC/ RA junction. Gastric decompression tube extends into the stomach. Lungs show low volumes without focal consolidation, edema or pneumothorax. No significant pleural fluid identified. The heart size remains normal. IMPRESSION: Low lung volumes without focal airspace consolidation. Electronically Signed   By: Aletta Edouard M.D.   On: 05/24/2016 08:06   Dg Chest Port 1 View  Result Date: 05/23/2016 CLINICAL DATA:  Central line placement. EXAM: PORTABLE CHEST 1 VIEW COMPARISON:  02/16/2015 FINDINGS: The patient is intubated. Endotracheal tube terminates 3 cm above the carina. Enteric catheter terminates within the stomach. Right internal jugular approach central venous catheter terminates at the cavoatrial junction. Cardiomediastinal silhouette is normal. Mediastinal contours appear intact. There is no evidence of focal airspace consolidation, pleural effusion or pneumothorax. Low lung volumes. Osseous structures are without acute abnormality. Soft tissues are grossly normal. IMPRESSION: Low lung volumes.  No evidence of pneumothorax. Support apparatus as described. Electronically Signed   By: Fidela Salisbury M.D.   On: 05/23/2016 17:13     STUDIES:  CT head 11/8 >>>  No acute finding by CT. 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 Old infarction left basal ganglia and left posterior frontal cortical and subcortical brain  CULTURES:   ANTIBIOTICS:   SIGNIFICANT EVENTS: Lt MCA angioplasty attempt 11/8  LINES/TUBES: ETT 11/8>>> Rt ij 11/8>>>  DISCUSSION: 50 yo female s/p failed attempt at L MCA angioplasty. On vent. Sedated.  ASSESSMENT / PLAN:  PULMONARY A: VDRF- intubated for angioplasty  COPD per chart- no PFTs in system P:   Wean aggressive, goa is extubation Will need IS if extubated, small volumes on pcxr Duonebs, budesonide, albuterol prn abg reviewed, keep same MV when on rest Lower prop to wean  CARDIOVASCULAR A:  Hypertension  P:  Cardene drip, goal SBP 120-150, prevent SBP < 120 for perfusion, hope can dc once agitation better off vent Continue home meds Defer resuming Brilinta to neurology? Continue ASA, statin  RENAL A:   HypoK, resus done P:   AM BMP k supp kvo  GASTROINTESTINAL A:   PPx P:   NPO SLP when extubated may be required with mca left intervention attempt pepcid for ppx  Until off vent  HEMATOLOGIC A:   dvt prev P:  scd Hep drip  INFECTIOUS A:   No acute  issues P:   Follow fever curve  ENDOCRINE A:   Insulin dependent diabetes   P:   SSI Dc any oral agents  NEUROLOGIC A:   S/p unsuccessful attempt at left MCA angioplasty Sedated P:   RASS goal: 0/-1 Propofol, prn fentanyl Stroke management per neurology- for brilinta and MRI brain WUA  FAMILY  - Updates: I updated husband  - Inter-disciplinary family meet or Palliative Care meeting due by: 05/30/16  Ccm time 30 min   Lavon Paganini. Titus Mould, MD, [place] Pgr: [facility] 05/24/2016 8:16 AM

## 2016-05-24 NOTE — Procedures (Signed)
Extubation Procedure Note  Patient Details:   Name: Jill Taylor DOB: 01-24-1966 MRN: [id number]   Airway Documentation:     Evaluation  O2 sats: stable throughout Complications: No apparent complications Patient did tolerate procedure well. Bilateral Breath Sounds: Rhonchi   Yes  Placed on 3l/min [place] IS instructed and achieved 471ml  Revonda Standard 05/24/2016, 9:12 AM

## 2016-05-24 NOTE — Progress Notes (Addendum)
STROKE TEAM PROGRESS NOTE   HISTORY OF PRESENT ILLNESS (per record) Jill Taylor is a 50 y.o. female with a history of recurrent episodes of aphasia who had angioplasty of her left MCA in July 2016. In August 2017, she was readmitted with unknown last known well with fluctuating leg which difficulties. Showed a patchy left MCA infarct. She had an outpatient catheter angiogram in September which showed restenosis of the left MCA. She had Plavix switched ticagrelor.   Due to her recurrent symptoms, she was planning to have a repeat angioplasty or stent done however during the procedure today, it turned out to be very difficult across the lesion. After multiple attempts, there was concern for possible worsening stenosis and on repeat study it appeared to have cleared. Given this, it was decided to abort the procedure and due to the concern for possible unstable plaque she was started on low-dose heparin.  LKW: 10:40 AM   SUBJECTIVE (INTERVAL HISTORY) Her husband is at the bedside.  Overall she feels her condition is rapidly improving. She was just extubated this am and she has no neuro deficit with bedside testing. MRI and MRA pending.   OBJECTIVE Temp:  [97.7 F (36.5 C)-100.6 F (38.1 C)] 98.7 F (37.1 C) (11/09 1700) Pulse Rate:  [79-107] 86 (11/09 1900) Cardiac Rhythm: Normal sinus rhythm (11/09 0400) Resp:  [9-25] 24 (11/09 1900) BP: (100-168)/(46-101) 150/73 (11/09 1900) SpO2:  [92 %-100 %] 98 % (11/09 1931) Arterial Line BP: (100-174)/(49-74) 157/62 (11/09 1900) FiO2 (%):  [40 %-50 %] 40 % (11/09 0842)  CBC:   Recent Labs Lab 05/23/16 0704 05/24/16 0325  WBC 8.5 13.0*  NEUTROABS 5.2 8.8*  HGB 14.8 11.9*  HCT 45.0 37.2  MCV 85.4 85.7  PLT 165 146*    Basic Metabolic Panel:   Recent Labs Lab 05/24/16 0325 05/24/16 0900  NA 141 140  K 3.0* 3.9  CL 110 112*  CO2 21* 22  GLUCOSE 133* 131*  BUN <5* <5*  CREATININE 0.75 0.67  CALCIUM 8.1* 7.9*  MG 1.9  --    PHOS 2.7  --     Lipid Panel:     Component Value Date/Time   CHOL 108 05/24/2016 0325   TRIG 111 05/24/2016 0325   HDL 42 05/24/2016 0325   CHOLHDL 2.6 05/24/2016 0325   VLDL 22 05/24/2016 0325   LDLCALC 44 05/24/2016 0325   HgbA1c:  Lab Results  Component Value Date   HGBA1C 8.8 (H) 03/06/2016   Urine Drug Screen:     Component Value Date/Time   LABOPIA NONE DETECTED 05/23/2016 1851   COCAINSCRNUR NONE DETECTED 05/23/2016 1851   LABBENZ POSITIVE (A) 05/23/2016 1851   AMPHETMU NONE DETECTED 05/23/2016 1851   THCU NONE DETECTED 05/23/2016 1851   LABBARB NONE DETECTED 05/23/2016 1851      IMAGING I have personally reviewed the radiological images below and agree with the radiology interpretations.  Ct Head Wo Contrast 05/23/2016 No acute finding by CT. 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 Old infarction left basal ganglia and left posterior frontal cortical and subcortical brain.   Dg Chest Port 1 View 05/23/2016 Low lung volumes.  No evidence of pneumothorax. Support apparatus as described.   MRI and MRA 05/24/2016 IMPRESSION: 1. At least 5 tiny acute infarcts in the left MCA territory. 2. Chronically irregular and narrowed left ICA terminus, M1 segment, and proximal ACA. The degree of M1 stenosis is improved compared to the final angiographic run from yesterday. Narrowing is also  improved when compared to CTA 03/05/2016 and MRA 12/27/2014. 3. 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 4. Moderate left PCA branch stenosis. 5. Chronic ischemic injury including multiple remote small left MCA territory infarcts.    PHYSICAL EXAM  Temp:  [97.7 F (36.5 C)-100.6 F (38.1 C)] 98.7 F (37.1 C) (11/09 1700) Pulse Rate:  [79-107] 86 (11/09 1900) Resp:  [9-25] 24 (11/09 1900) BP: (100-168)/(46-101) 150/73 (11/09 1900) SpO2:  [92 %-100 %] 98 % (11/09 1931) Arterial Line BP: (100-174)/(49-74) 157/62 (11/09 1900) FiO2 (%):  [40 %-50 %] 40 % (11/09  0842)  General - Well nourished, well developed, in no apparent distress, extubated and lethargic  Ophthalmologic - Fundi not visualized due to lethargy.  Cardiovascular - Regular rate and rhythm.  Mental Status -  Level of arousal and orientation to time, place, and person were intact. Language including expression, naming, repetition, comprehension was assessed and found intact. Fund of Knowledge was assessed and was intact.  Cranial Nerves II - XII - II - Visual field intact OU. III, IV, VI - Extraocular movements intact. V - Facial sensation intact bilaterally. VII - Facial movement intact bilaterally. VIII - Hearing & vestibular intact bilaterally. X - Palate elevates symmetrically. XI - Chin turning & shoulder shrug intact bilaterally. XII - Tongue protrusion intact.  Motor Strength - The patient's strength was normal in all extremities and pronator drift was absent.  Bulk was normal and fasciculations were absent.   Motor Tone - Muscle tone was assessed at the neck and appendages and was normal.  Reflexes - The patient's reflexes were 1+ in all extremities and she had no pathological reflexes.  Sensory - Light touch, temperature/pinprick were assessed and were symmetrical.    Coordination - The patient had normal movements in the hands and feet with no ataxia or dysmetria.  Tremor was absent.  Gait and Station - deferred due to lethargy   ASSESSMENT/PLAN [name] is a 50 y.o. female with history of a previous stroke, PTSD, neuropathy, hypertension, hyperlipidemia, fibromyalgia, diabetes mellitus, COPD, ongoing tobacco use, previous cerebral aneurysm clipping, bipolar disorder, dysrhythmia, and previous angioplasties the left middle cerebral artery presenting with recurrent episodes of aphasia. She did not receive IV t-PA due to attempts at repeat angioplasty with possible stent which unfortunately were unsuccessful.  Stroke:  Punctate left MCA infarct  secondary to recent procedure and/or left middle cerebral artery stenosis.  Resultant asymptomatic neurologically  MRI - left MCA punctate infarct  MRA - left MCA severe stenosis with mild improvement after procedure  2D Echo - 03/06/2016 - EF 55-60%. No cardiac source of emboli identified.  LDL - 44  HgbA1c - pending  VTE prophylaxis - SCDs Diet Carb Modified Fluid consistency: Thin; Room service appropriate? Yes  aspirin 81 mg daily and Brilinta 45 mg BID prior to admission, now on aspirin 325 mg daily and Brilinta 45 mg BID.  Patient counseled to be compliant with her antithrombotic medications  Ongoing aggressive stroke risk factor management  Therapy recommendations: pending  Disposition: Pending  Follow up with Dr. Leonie Man at [facility]  Hypertension  Stable  Long-term BP goal 130-150 due to left MCA stenosis  Hyperlipidemia  Home meds:  Lipitor 80 mg daily resumed in hospital  LDL 44, goal < 70  Continue statin at discharge  Diabetes  HgbA1c pending, goal < 7.0  Controlled  SSI   CBG monitoring  Tobacco abuse  Current smoker  Smoking cessation counseling provided  Pt is willing to  quit  Other Stroke Risk Factors  Obesity, Body mass index is 32.5 kg/m., recommend weight loss, diet and exercise as appropriate   Hx stroke/TIA  Family hx stroke (paternal grandmother)  Other Active Problems  Leukocytosis - 13  Hypokalemia - supplement.  Hospital day # 1  Neurology will sign off. Please call with questions. Pt will follow up with Dr. Leonie Man at [facility] as scheduled. Thanks for the consult.  Rosalin Hawking, MD PhD Stroke Neurology 05/24/2016 8:14 PM  ADDENDUM:  Pt exam showed no focal neurological deficit. Asymptomatic from MRI lesions which was small. Would not consider that PT/OT was necessary at that time. Pt can be discharged safely from neurology standpoint.  Rosalin Hawking, MD PhD Stroke Neurology 06/22/2016 4:27 PM   To contact Stroke  Continuity provider, please refer to [email or web address] After hours, contact General Neurology

## 2016-05-24 NOTE — Progress Notes (Signed)
[place] Progress Note Patient Name: Jill Taylor DOB: Oct 10, 1965 MRN: [id number]   Date of Service  05/24/2016  HPI/Events of Note  K+ = 3.0 and Creatinine = 0.75.  eICU Interventions  Will replace K+.      Intervention Category Intermediate Interventions: Electrolyte abnormality - evaluation and management  Abdo Denault [place] 05/24/2016, 5:17 AM

## 2016-05-24 NOTE — Progress Notes (Signed)
ANTICOAGULATION CONSULT NOTE - Follow Up Consult  Pharmacy Consult for heparin Indication: s/p cerebral angiogram  Labs:  Recent Labs  05/23/16 0704 05/23/16 2235  HGB 14.8  --   HCT 45.0  --   PLT 165  --   LABPROT 11.9  --   INR 0.88  --   HEPARINUNFRC  --  0.11*  CREATININE 0.78  --     Assessment/Plan:  50yo female therapeutic on heparin with initial dosing s/p cerebral angiogram. Will continue gtt at current rate until off at 0700.   Wynona Neat, PharmD, BCPS  05/24/2016,12:03 AM

## 2016-05-24 NOTE — Progress Notes (Signed)
Deployed 6Fr exoseal at approximately 0909 hrs, hemostasis achieved 10 minutes later, no complications, reviewed with RN Joelene Millin.  -CAS

## 2016-05-24 NOTE — Evaluation (Signed)
Clinical/Bedside Swallow Evaluation Patient Details  Name: Jill Taylor MRN: [id number] Date of Birth: [birth year]  Today's Date: 05/24/2016 Time: SLP Start Time (ACUTE ONLY): 1337 SLP Stop Time (ACUTE ONLY): 1351 SLP Time Calculation (min) (ACUTE ONLY): 14 min  Past Medical History:  Past Medical History:  Diagnosis Date  . Anxiety   . Bipolar disorder ([place])   . Brain aneurysm   . COPD (chronic obstructive pulmonary disease) ([place])   . Depression   . Diabetes mellitus without complication ([place])    [place] II  . Dysrhythmia   . Fibromyalgia   . GERD (gastroesophageal reflux disease)   . Headache   . High cholesterol   . Hypertension    "not anymore"  . IBS (irritable bowel syndrome)   . Neuropathy ([place])   . Pneumonia 2013 ish  . PTSD (post-traumatic stress disorder)   . Shortness of breath dyspnea    with exertion  . Stroke [facility]) 05/2014   05/2014-TIA, TIA and CVA- 7 since 05/2014- memory loss and expressive aphasia. Left sided weakness. "2 in Auguat 2017- hospitalizized  Aug 21- Aug 30, @017  "they said I have weakness on left side, I dont see it."  . UTI (lower urinary tract infection)    Past Surgical History:  Past Surgical History:  Procedure Laterality Date  . ABDOMINAL HYSTERECTOMY    . BRAIN SURGERY  2007  . BRAIN SURGERY    . CHOLECYSTECTOMY    . IR GENERIC HISTORICAL  03/16/2016   IR ANGIO INTRA EXTRACRAN SEL COM CAROTID INNOMINATE BILAT MOD SED 03/16/2016 Luanne Bras, MD [facility]-INTERV RAD  . IR GENERIC HISTORICAL  03/16/2016   IR ANGIO VERTEBRAL SEL VERTEBRAL UNI R MOD SED 03/16/2016 Luanne Bras, MD [facility]INTER[facility]  . RADIOLOGY WITH ANESTHESIA N/A 02/09/2015   Procedure: ANGIOPLASTY;  Surgeon: Luanne Bras, MD;  Location: [place];  Service: Radiology;  Laterality: N/A;  . RADIOLOGY WITH ANESTHESIA N/A 05/23/2016   Procedure: STENTING;  Surgeon: Luanne Bras, MD;  Location: [place];  Service: Radiology;  Laterality: N/A;  . 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 Admitted after unsuccessful attempt at left MCA angioplasty. Intubated 11/7-11/9. CXR Low lung volumes without focal airspace consolidation. PMH: bipolar, GERD, fibromyalgia, COPD, DM, HTN, stroke, pna, brain aneurysm, PTSD. BSE 02/2016 with normal swallow function   Assessment / Plan / Recommendation Clinical Impression  Pt's vocal quality and cough functional. No complaints of dysphagia from pt or spouse. Oral and pharyngeal swallow function appropriate without concerns for dysphagia. Recommend regular texture, thin liquids, pills with thin. Educated pt to use extra caution the next few days. No ST follow up needed.    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 Admitted after unsuccessful attempt at left MCA angioplasty. Intubated 11/7-11/9. CXR  Low lung volumes without focal airspace consolidation. PMH: bipolar, GERD, fibromyalgia, COPD, DM, HTN, stroke, pna, brain aneurysm, PTSD. BSE 02/2016 with normal swallow function Type of Study: Bedside Swallow Evaluation Previous Swallow Assessment:  (see HPI) Diet Prior to this Study: NPO Temperature Spikes Noted: Yes Respiratory Status: Room air History of Recent Intubation: Yes Length of Intubations (days):  (less than a day for procedure) Date extubated: 05/24/16 Behavior/Cognition:  Alert;Cooperative;Pleasant mood Oral Cavity Assessment: Within Functional Limits Oral Care Completed by SLP: No Oral Cavity - Dentition: Edentulous Vision: Functional for self-feeding Self-Feeding Abilities: Able to feed self Patient Positioning: Upright in bed Baseline Vocal Quality: Normal Volitional Cough: Strong Volitional Swallow: Able to elicit    Oral/Motor/Sensory Function Overall Oral Motor/Sensory Function: Within functional limits   Ice Chips Ice chips: Not tested   Thin Liquid Thin Liquid: Within functional limits Presentation: Cup;Straw    Nectar Thick Nectar Thick Liquid: Not tested   Honey Thick Honey Thick Liquid: Not tested   Puree Puree: Within functional limits   Solid   GO   Solid: Within functional limits        Houston Siren 05/24/2016,3:38 PM   Orbie Pyo Colvin Caroli.Ed [employer] [phone]

## 2016-05-24 NOTE — Progress Notes (Signed)
CRITICAL VALUE ALERT  Critical value received:  K 3.0  Date of notification:  05/24/16  Time of notification:  0510    Critical value read back:Yes.    Nurse who received alert:  Phebe Colla, RN  MD notified (1st page):  [place]  Time of first page:  [phone]  MD notified (2nd page):  Time of second page:  Responding MD:  Oletta Darter  Time MD responded:  [phone]

## 2016-05-24 NOTE — Care Management Note (Signed)
Case Management Note  Patient Details  Name: Jill Taylor MRN: [id number] Date of Birth: Mar 16, 1966  Subjective/Objective:   Pt admitted on 05/23/16 with Lt MCA stenosis.  PTA, pt independent, lives with spouse.                   Action/Plan: Will follow for discharge planning as pt progresses.    Expected Discharge Date:                  Expected Discharge Plan:  Home/Self Care  In-House Referral:     Discharge planning Services  CM Consult  Post Acute Care Choice:    Choice offered to:     DME Arranged:    DME Agency:     HH Arranged:    HH Agency:     Status of Service:  In process, will continue to follow  If discussed at Long Length of Stay Meetings, dates discussed:    Additional Comments:  Reinaldo Raddle, RN, BSN  Trauma/Neuro ICU Case Manager [phone]

## 2016-05-25 ENCOUNTER — Telehealth: Payer: Self-pay

## 2016-05-25 DIAGNOSIS — J449 Chronic obstructive pulmonary disease, unspecified: Secondary | ICD-10-CM

## 2016-05-25 LAB — PLATELET INHIBITION P2Y12: Platelet Function  P2Y12: 105 [PRU] — ABNORMAL LOW (ref 194–418)

## 2016-05-25 LAB — GLUCOSE, CAPILLARY
GLUCOSE-CAPILLARY: 254 mg/dL — AB (ref 65–99)
GLUCOSE-CAPILLARY: 218 mg/dL — AB (ref 65–99)
Glucose-Capillary: 263 mg/dL — ABNORMAL HIGH (ref 65–99)
Glucose-Capillary: 157 mg/dL — ABNORMAL HIGH (ref 65–99)

## 2016-05-25 LAB — HEMOGLOBIN A1C
HEMOGLOBIN A1C: 11.8 % — AB (ref 4.8–5.6)
Mean Plasma Glucose: 292 mg/dL

## 2016-05-25 MED ORDER — HYDRALAZINE HCL 20 MG/ML IJ SOLN
10.0000 mg | INTRAMUSCULAR | Status: DC | PRN
Start: 2016-05-25 — End: 2016-05-25
  Administered 2016-05-25: 10 mg via INTRAVENOUS

## 2016-05-25 MED ORDER — HYDRALAZINE HCL 20 MG/ML IJ SOLN
INTRAMUSCULAR | Status: AC
  Filled 2016-05-25: qty 1

## 2016-05-25 MED ORDER — FAMOTIDINE 20 MG PO TABS
20.0000 mg | ORAL_TABLET | Freq: Every day | ORAL | Status: DC
  Administered 2016-05-25: 20 mg via ORAL
  Filled 2016-05-25: qty 1

## 2016-05-25 NOTE — Progress Notes (Signed)
eLink Physician-Brief Progress Note Patient Name: Suman Mccourt DOB: [birth year] MRN: [id number]   Date of Service  05/25/2016  HPI/Events of Note  Hypertension - BP = 170/83.  eICU Interventions  Will order: 1. Hydralazine 10 mg IV Q 4 hours PRN.      Intervention Category Major Interventions: Hypertension - evaluation and management  Gaylord Seydel Eugene 05/25/2016, 5:08 AM

## 2016-05-25 NOTE — Progress Notes (Signed)
PULMONARY / CRITICAL CARE MEDICINE   Name: Jill Taylor MRN: [id number] DOB: 08-Nov-1965    ADMISSION DATE:  05/23/2016 CONSULTATION DATE:  05/23/16  REFERRING MD:  Dr. Estanislado Pandy  CHIEF COMPLAINT:  Acute resp failure post L MCA stent attempt  HISTORY OF PRESENT ILLNESS:   50 yo female had Lt MCA CVA August 2017 and returns for Lt MCA angioplasty.  She was intubated for procedure.  SUBJECTIVE:  Feels better.  Denies chest congestion.  VITAL SIGNS: BP 136/66   Pulse 88   Temp 99.5 F (37.5 C) (Oral)   Resp (!) 21   Ht 5\' 1"  (1.549 m)   Wt 172 lb (78 kg)   SpO2 96%   BMI 32.50 kg/m   HEMODYNAMICS: CVP:  [9 mmHg-10 mmHg] 10 mmHg  INTAKE / OUTPUT: I/O last 3 completed shifts: In: 3572.5 [P.O.:1200; I.V.:1792.5; IV Piggyback:580] Out: 3700 [Urine:3700]  PHYSICAL EXAMINATION: General: pleasant Neuro: follows commands HEENT: no stridor Cardiovascular: regular Lungs: no wheeze Abdomen:  Soft, nontender, nondistended Musculoskeletal:  No clubbing, cyanosis Skin:  Warm, dry, intact   LABS:  BMET  Recent Labs Lab 05/23/16 0704 05/24/16 0325 05/24/16 0900  NA 136 141 140  K 3.8 3.0* 3.9  CL 104 110 112*  CO2 21* 21* 22  BUN 7 <5* <5*  CREATININE 0.78 0.75 0.67  GLUCOSE 309* 133* 131*    Electrolytes  Recent Labs Lab 05/23/16 0704 05/24/16 0325 05/24/16 0900  CALCIUM 9.3 8.1* 7.9*  MG  --  1.9  --   PHOS  --  2.7  --     CBC  Recent Labs Lab 05/23/16 0704 05/24/16 0325  WBC 8.5 13.0*  HGB 14.8 11.9*  HCT 45.0 37.2  PLT 165 146*    Coag's  Recent Labs Lab 05/23/16 0704  INR 0.88    Sepsis Markers No results for input(s): LATICACIDVEN, PROCALCITON, O2SATVEN in the last 168 hours.  ABG  Recent Labs Lab 05/23/16 1530 05/24/16 0325  PHART 7.300* 7.409  PCO2ART 44.9 35.9  PO2ART 119* 181*    Liver Enzymes No results for input(s): AST, ALT, ALKPHOS, BILITOT, ALBUMIN in the last 168 hours.  Cardiac Enzymes No results for  input(s): TROPONINI, PROBNP in the last 168 hours.  Glucose  Recent Labs Lab 05/23/16 1516 05/23/16 1953 05/24/16 0010 05/24/16 0408 05/24/16 0844 05/24/16 1223  GLUCAP 149* 152* 143* 124* 115* 113*    Imaging Mr [facility] Wo Contrast  Result Date: 05/24/2016 CLINICAL DATA:  Left MCA angioplasty attempt. EXAM: MRI HEAD WITHOUT CONTRAST MRA HEAD WITHOUT CONTRAST TECHNIQUE: Multiplanar, multiecho pulse sequences of the brain and surrounding structures were obtained without intravenous contrast. Angiographic images of the head were obtained using MRA technique without contrast. COMPARISON:  03/06/2016 brain MRI. FINDINGS: MRI HEAD FINDINGS Brain: Approximately 5 sub centimeter acute infarcts on the cortex of the perisylvian left cerebral hemisphere, MCA territory. No acute hemorrhage. Small scattered chronic left MCA cortex infarcts. Chronic microvascular ischemic injury in the cerebral white matter with lacunes in the bilateral caudate nuclei and right corona radiata. Artifact from right MCA aneurysm clips. Vascular: See below.  Normal flow voids in the dural venous sinuses. Skull and upper cervical spine: Unremarkable right pterional craniotomy. Sinuses/Orbits: Negative MRA HEAD FINDINGS Symmetric ICA. Chronic left M1 segment diffuse stenosis with irregular appearance which may be from small neighboring hypertrophied vessels or channeling. The degree of stenosis is overall less severe than on 12/27/2014 MRA and on CTA 03/05/2016. Left supraclinoid ICA stenosis is also  improved compared to these nondiagnostic imaging studies. The proximal left A1 segment is severely stenotic, with good anterior communicating artery collateral flow. Status post aneurysm clipping in the right MCA region, with artifact obscuring signal at the M1 and proximal M2 level. No unusual vertebrobasilar branching. Moderate left inferior PCA branch stenosis. IMPRESSION: 1. At least 5 tiny acute infarcts in the left MCA territory.  2. Chronically irregular and narrowed left ICA terminus, M1 segment, and proximal ACA. The degree of M1 stenosis is improved compared to the final angiographic run from yesterday. Narrowing is also improved when compared to CTA 03/05/2016 and MRA 12/27/2014. 3. 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 4. Moderate left PCA branch stenosis. 5. Chronic ischemic injury including multiple remote small left MCA territory infarcts. Electronically Signed   By: Monte Fantasia M.D.   On: 05/24/2016 16:47   Mr Brain Wo Contrast  Result Date: 05/24/2016 CLINICAL DATA:  Left MCA angioplasty attempt. EXAM: MRI HEAD WITHOUT CONTRAST MRA HEAD WITHOUT CONTRAST TECHNIQUE: Multiplanar, multiecho pulse sequences of the brain and surrounding structures were obtained without intravenous contrast. Angiographic images of the head were obtained using MRA technique without contrast. COMPARISON:  03/06/2016 brain MRI. FINDINGS: MRI HEAD FINDINGS Brain: Approximately 5 sub centimeter acute infarcts on the cortex of the perisylvian left cerebral hemisphere, MCA territory. No acute hemorrhage. Small scattered chronic left MCA cortex infarcts. Chronic microvascular ischemic injury in the cerebral white matter with lacunes in the bilateral caudate nuclei and right corona radiata. Artifact from right MCA aneurysm clips. Vascular: See below.  Normal flow voids in the dural venous sinuses. Skull and upper cervical spine: Unremarkable right pterional craniotomy. Sinuses/Orbits: Negative MRA HEAD FINDINGS Symmetric ICA. Chronic left M1 segment diffuse stenosis with irregular appearance which may be from small neighboring hypertrophied vessels or channeling. The degree of stenosis is overall less severe than on 12/27/2014 MRA and on CTA 03/05/2016. Left supraclinoid ICA stenosis is also improved compared to these nondiagnostic imaging studies. The proximal left A1 segment is severely stenotic, with good anterior communicating artery  collateral flow. Status post aneurysm clipping in the right MCA region, with artifact obscuring signal at the M1 and proximal M2 level. No unusual vertebrobasilar branching. Moderate left inferior PCA branch stenosis. IMPRESSION: 1. At least 5 tiny acute infarcts in the left MCA territory. 2. Chronically irregular and narrowed left ICA terminus, M1 segment, and proximal ACA. The degree of M1 stenosis is improved compared to the final angiographic run from yesterday. Narrowing is also improved when compared to CTA 03/05/2016 and MRA 12/27/2014. 3. 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 4. Moderate left PCA branch stenosis. 5. Chronic ischemic injury including multiple remote small left MCA territory infarcts. Electronically Signed   By: Monte Fantasia M.D.   On: 05/24/2016 16:47     STUDIES:  CT head 11/8 >>> No acute finding by CT. 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 Old infarction left basal ganglia and left posterior frontal cortical and subcortical brain MRI brain 11/9 >>> multiple tiny acute infarcts in Lt MCA territory, Rt MCA aneurysm clipping, moderate Lt PCA stenosis, chronic narrowing Lt ICA and proximal ACA  SIGNIFICANT EVENTS: 11/8 Lt MCA angioplasty attempt  LINES/TUBES: ETT 11/8 >> 11/9 Rt IJ CVL11/8 >> 11/10  DISCUSSION: 50 yo female s/p failed attempt at L MCA angioplasty. On vent. Sedated.  ASSESSMENT / PLAN:  Acute respiratory failure with compromised airway after neuro IR procedure. Hx of COPD. Memory Dance with prn duoneb  Recent CVA s/p  attempt at angioplasty of Lt MCA. - per neurology, neuro IR - continue ASA, brilinta  Hx of HTN, HLD. - lipitor  Hx of DM. - per primary team  Hx of bipolar. - buspar, seroquel  PCCM will sign off.  Chesley Mires, MD [facility] Pulmonary/Critical Care 05/25/2016, 8:27 AM Pager:  [phone] After 3pm call: [phone]

## 2016-05-25 NOTE — Telephone Encounter (Signed)
LFt vm for patients daughter Luetta Nutting who is on her DPR from. Pt is currently in the hospital as of 05/23/2016. Rn left vm to call back about TCD doppler test.

## 2016-05-25 NOTE — Telephone Encounter (Signed)
Rn receive incoming call from pts daughter Museum/gallery conservator. Amber stated her mom was in the hospital and should be getting d/c today. Rn stated her TCD results are: mild narrowing of left middle cerebral, right arteries, nothing to worry about. PTs daughter verbalized understanding.

## 2016-05-25 NOTE — Discharge Summary (Signed)
Patient ID: Jill Taylor MRN: [id number] DOB/AGE: Sep 27, 1965 50 y.o.  Admit date: 05/23/2016 Discharge date: 05/25/2016  Supervising Physician: Luanne Bras  Patient Status: [facility] - In-pt  Admission Diagnoses: left middle cerebral artery stenosis  Discharge Diagnoses:  Active Problems:   Middle cerebral artery stenosis, left   Acute respiratory failure ([place])   Encounter for central line placement   Coarctation of aorta, recurrent, post-intervention   Discharged Condition: stable  Hospital Course:   Remote History Rt cerebral artery aneurysm clipping. Previous CVA 01/2015. Angioplasty L MCA performed without complication. New CVA 02/2016: Work up revealed Left middle cerebral artery stenosis. Attempted L MCA angioplasty in IR with Dr Estanislado Pandy 11/8; was unable to safely cross stenosis. Aborted procedure and admitted for observation. Pt was agitated in Neuro ICU--remained intubated until 11/9. MRI/MRA 11/9 pm: IMPRESSION: 1. At least 5 tiny acute infarcts in the left MCA territory. 2. Chronically irregular and narrowed left ICA terminus, M1 segment, and proximal ACA. The degree of M1 stenosis is improved compared to the final angiographic run from yesterday. Narrowing is also improved when compared to CTA 03/05/2016 and MRA 12/27/2014. 3. 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 4. Moderate left PCA branch stenosis. 5. Chronic ischemic injury including multiple remote small left MCA territory infarcts.  Pt doing well this am. Moving all 4s; neuro intact. Left hand minimally weaker than Rt. Denies headache or pain Denies speech or vision changes Eating well; slept well Ambulating without help Urinating on own and has had BM.  I have seen and examined pt; discussed status with Dr Estanislado Pandy Will check [phone] stat   Consults: neurology; Dr Rosalin Hawking  Significant Diagnostic Studies: Cerebral arteriogram  Treatments:  CEREBRAL ANGIOGRAM  [id number] (Custom)]       S/P Ltb common carotid arteriogram,followed by unsuccessful attempt  At LT MCA angioplasty       Discharge Exam: Blood pressure 136/66, pulse 88, temperature 98.1 F (36.7 C), temperature source Oral, resp. rate (!) 21, height 5\' 1"  (1.549 m), weight 172 lb (78 kg), SpO2 100 %.  PE: A/O Pleasant Appropriate Face symmetrical Smile = Tongue midline Puffs cheeks= Heart: RRR Lungs: CTA Abd: Soft; +BS; NT Ext: FROM Good strength and sensation Left hand minimally weaker than Rt Ambulating without help  Rt groin NT no bleeding; no hematoma Rt foot 2+ pulses  UOP great; yellow + BM  Results for orders placed or performed during the hospital encounter of 05/23/16  MRSA PCR Screening  Result Value Ref Range   MRSA by PCR NEGATIVE NEGATIVE  Basic metabolic panel  Result Value Ref Range   Sodium 136 135 - 145 mmol/L   Potassium 3.8 3.5 - 5.1 mmol/L   Chloride 104 101 - 111 mmol/L   CO2 21 (L) 22 - 32 mmol/L   Glucose, Bld 309 (H) 65 - 99 mg/dL   BUN 7 6 - 20 mg/dL   Creatinine, Ser 0.78 0.44 - 1.00 mg/dL   Calcium 9.3 8.9 - 10.3 mg/dL   GFR calc non Af Amer >60 >60 mL/min   GFR calc Af Amer >60 >60 mL/min   Anion gap 11 5 - 15  CBC with Differential/Platelet  Result Value Ref Range   WBC 8.5 4.0 - 10.5 K/uL   RBC 5.27 (H) 3.87 - 5.11 MIL/uL   Hemoglobin 14.8 12.0 - 15.0 g/dL   HCT 45.0 36.0 - 46.0 %   MCV 85.4 78.0 - 100.0 fL   MCH 28.1 26.0 - 34.0 pg  MCHC 32.9 30.0 - 36.0 g/dL   RDW 14.3 11.5 - 15.5 %   Platelets 165 150 - 400 K/uL   Neutrophils Relative % 61 %   Neutro Abs 5.2 1.7 - 7.7 K/uL   Lymphocytes Relative 30 %   Lymphs Abs 2.5 0.7 - 4.0 K/uL   Monocytes Relative 6 %   Monocytes Absolute 0.5 0.1 - 1.0 K/uL   Eosinophils Relative 2 %   Eosinophils Absolute 0.2 0.0 - 0.7 K/uL   Basophils Relative 1 %   Basophils Absolute 0.1 0.0 - 0.1 K/uL  Platelet inhibition p2y12 (Not at [facility])  Result Value Ref Range   Platelet  Function  P2Y12 47 (L) 194 - 418 PRU  Protime-INR  Result Value Ref Range   Prothrombin Time 11.9 11.4 - 15.2 seconds   INR 0.88   Glucose, capillary  Result Value Ref Range   Glucose-Capillary 232 (H) 65 - 99 mg/dL  Draw ABG 1 hour after initiation of ventilator  Result Value Ref Range   FIO2 50.00    Delivery systems VENTILATOR    Mode PRESSURE REGULATED VOLUME CONTROL    VT 400 mL   LHR 15.0 resp/min   Peep/cpap 5.0 cm H20   pH, Arterial 7.300 (L) 7.350 - 7.450   pCO2 arterial 44.9 32.0 - 48.0 mmHg   pO2, Arterial 119 (H) 83.0 - 108.0 mmHg   Bicarbonate 21.6 20.0 - 28.0 mmol/L   Acid-base deficit 3.9 (H) 0.0 - 2.0 mmol/L   O2 Saturation 97.0 %   Patient temperature 98.1    Collection site A-LINE    Drawn by [phone]    Sample type ARTERIAL DRAW    Allens test (pass/fail) PASS PASS  Triglycerides  Result Value Ref Range   Triglycerides 107 <150 mg/dL  Urine rapid drug screen (hosp performed)  Result Value Ref Range   Opiates NONE DETECTED NONE DETECTED   Cocaine NONE DETECTED NONE DETECTED   Benzodiazepines POSITIVE (A) NONE DETECTED   Amphetamines NONE DETECTED NONE DETECTED   Tetrahydrocannabinol NONE DETECTED NONE DETECTED   Barbiturates NONE DETECTED NONE DETECTED  Glucose, capillary  Result Value Ref Range   Glucose-Capillary 149 (H) 65 - 99 mg/dL  Basic metabolic panel  Result Value Ref Range   Sodium 141 135 - 145 mmol/L   Potassium 3.0 (L) 3.5 - 5.1 mmol/L   Chloride 110 101 - 111 mmol/L   CO2 21 (L) 22 - 32 mmol/L   Glucose, Bld 133 (H) 65 - 99 mg/dL   BUN <5 (L) 6 - 20 mg/dL   Creatinine, Ser 0.75 0.44 - 1.00 mg/dL   Calcium 8.1 (L) 8.9 - 10.3 mg/dL   GFR calc non Af Amer >60 >60 mL/min   GFR calc Af Amer >60 >60 mL/min   Anion gap 10 5 - 15  Magnesium  Result Value Ref Range   Magnesium 1.9 1.7 - 2.4 mg/dL  Phosphorus  Result Value Ref Range   Phosphorus 2.7 2.5 - 4.6 mg/dL  Blood gas, arterial  Result Value Ref Range   FIO2 50.00    Delivery  systems VENTILATOR    Mode PRESSURE REGULATED VOLUME CONTROL    VT 400 mL   LHR 20 resp/min   Peep/cpap 5.0 cm H20   pH, Arterial 7.409 7.350 - 7.450   pCO2 arterial 35.9 32.0 - 48.0 mmHg   pO2, Arterial 181 (H) 83.0 - 108.0 mmHg   Bicarbonate 22.3 20.0 - 28.0 mmol/L   Acid-base deficit 1.7  0.0 - 2.0 mmol/L   O2 Saturation 98.6 %   Patient temperature 98.6    Collection site A-LINE    Drawn by [phone]    Sample type ARTERIAL   CBC WITH DIFFERENTIAL  Result Value Ref Range   WBC 13.0 (H) 4.0 - 10.5 K/uL   RBC 4.34 3.87 - 5.11 MIL/uL   Hemoglobin 11.9 (L) 12.0 - 15.0 g/dL   HCT 37.2 36.0 - 46.0 %   MCV 85.7 78.0 - 100.0 fL   MCH 27.4 26.0 - 34.0 pg   MCHC 32.0 30.0 - 36.0 g/dL   RDW 14.5 11.5 - 15.5 %   Platelets 146 (L) 150 - 400 K/uL   Neutrophils Relative % 69 %   Neutro Abs 8.8 (H) 1.7 - 7.7 K/uL   Lymphocytes Relative 26 %   Lymphs Abs 3.4 0.7 - 4.0 K/uL   Monocytes Relative 4 %   Monocytes Absolute 0.6 0.1 - 1.0 K/uL   Eosinophils Relative 1 %   Eosinophils Absolute 0.1 0.0 - 0.7 K/uL   Basophils Relative 0 %   Basophils Absolute 0.0 0.0 - 0.1 K/uL  Glucose, capillary  Result Value Ref Range   Glucose-Capillary 152 (H) 65 - 99 mg/dL  Heparin level (unfractionated)  Result Value Ref Range   Heparin Unfractionated 0.11 (L) 0.30 - 0.70 IU/mL  Lipid panel  Result Value Ref Range   Cholesterol 108 0 - 200 mg/dL   Triglycerides 111 <150 mg/dL   HDL 42 >40 mg/dL   Total CHOL/HDL Ratio 2.6 RATIO   VLDL 22 0 - 40 mg/dL   LDL Cholesterol 44 0 - 99 mg/dL  Hemoglobin A1c  Result Value Ref Range   Hgb A1c MFr Bld 11.8 (H) 4.8 - 5.6 %   Mean Plasma Glucose 292 mg/dL  TSH  Result Value Ref Range   TSH 1.907 0.350 - 4.500 uIU/mL  Vitamin B12  Result Value Ref Range   Vitamin B-12 469 180 - 914 pg/mL  Glucose, capillary  Result Value Ref Range   Glucose-Capillary 143 (H) 65 - 99 mg/dL   Comment 1 Notify RN   Basic metabolic panel  Result Value Ref Range   Sodium  140 135 - 145 mmol/L   Potassium 3.9 3.5 - 5.1 mmol/L   Chloride 112 (H) 101 - 111 mmol/L   CO2 22 22 - 32 mmol/L   Glucose, Bld 131 (H) 65 - 99 mg/dL   BUN <5 (L) 6 - 20 mg/dL   Creatinine, Ser 0.67 0.44 - 1.00 mg/dL   Calcium 7.9 (L) 8.9 - 10.3 mg/dL   GFR calc non Af Amer >60 >60 mL/min   GFR calc Af Amer >60 >60 mL/min   Anion gap 6 5 - 15  Glucose, capillary  Result Value Ref Range   Glucose-Capillary 124 (H) 65 - 99 mg/dL  Glucose, capillary  Result Value Ref Range   Glucose-Capillary 115 (H) 65 - 99 mg/dL   Comment 1 Notify RN    Comment 2 Document in Chart   Glucose, capillary  Result Value Ref Range   Glucose-Capillary 113 (H) 65 - 99 mg/dL   Comment 1 Notify RN    Comment 2 Document in Chart       Disposition: Left middle cerebral artery stenosis Unsuccessful attempt at angioplasty in IR 11/8 Plan for discharge today Neuro intact Doing well without complaints Dr Estanislado Pandy aware of status of pt Continue all home meds Brilinta 45 mg BID; ASA  81  mg daily 2 week follow up appt with Dr Estanislado Pandy Pt has good understanding of discharge instructions    Discharge Instructions    Call MD for:  difficulty breathing, headache or visual disturbances    Complete by:  As directed    Call MD for:  extreme fatigue    Complete by:  As directed    Call MD for:  hives    Complete by:  As directed    Call MD for:  persistant dizziness or light-headedness    Complete by:  As directed    Call MD for:  persistant nausea and vomiting    Complete by:  As directed    Call MD for:  redness, tenderness, or signs of infection (pain, swelling, redness, odor or green/yellow discharge around incision site)    Complete by:  As directed    Call MD for:  severe uncontrolled pain    Complete by:  As directed    Call MD for:  temperature >100.4    Complete by:  As directed    Diet - low sodium heart healthy    Complete by:  As directed    Discharge instructions    Complete by:  As  directed    2 week follow up with Dr Estanislado Pandy; resume all home meds; pt will hear from scheduler for time and date of appt; call [phone] if questions or concerns   Discharge wound care:    Complete by:  As directed    May shower today; place new bandaid on Rt groin site every day x 1 week   Driving Restrictions    Complete by:  As directed    No driving x 2 weeks   Increase activity slowly    Complete by:  As directed    Lifting restrictions    Complete by:  As directed    No lifting over 10 lbs x 2 weeks       Medication List    TAKE these medications   ACETAMINOPHEN-BUTALBITAL 50-325 MG Tabs Take 2 tablets by mouth daily as needed (for headache or migraine).   albuterol 108 (90 Base) MCG/ACT inhaler Commonly known as:  PROVENTIL HFA;VENTOLIN HFA Inhale 2 puffs into the lungs every 6 (six) hours as needed for wheezing or shortness of breath.   aspirin EC 81 MG tablet Take 81 mg by mouth daily.   atorvastatin 40 MG tablet Commonly known as:  LIPITOR Take 2 tablets (80 mg total) by mouth at bedtime.   BREO ELLIPTA 100-25 MCG/INH Aepb Generic drug:  fluticasone furoate-vilanterol Inhale 2 puffs into the lungs daily.   BRILINTA 90 MG Tabs tablet Generic drug:  ticagrelor Take 45 mg by mouth 2 (two) times daily.   busPIRone 10 MG tablet Commonly known as:  BUSPAR Take 10 mg by mouth 2 (two) times daily.   cetirizine 10 MG tablet Commonly known as:  ZYRTEC Take 10 mg by mouth at bedtime.   clonazePAM 0.5 MG tablet Commonly known as:  KLONOPIN Take 1 tablet (0.5 mg total) by mouth 2 (two) times daily as needed for anxiety. What changed:  when to take this   Gabapentin Enacarbil 600 MG Tbcr Take 600 mg by mouth at bedtime.   glipiZIDE 10 MG 24 hr tablet Commonly known as:  GLUCOTROL XL Take 10 mg by mouth 2 (two) times daily.   HYDROcodone-acetaminophen 5-325 MG tablet Commonly known as:  NORCO/VICODIN Take 0.5-1 tablets by mouth daily as needed (for  migraine headache  pain not relieved by Butalbital and acetaminophen).   INVOKANA 300 MG Tabs tablet Generic drug:  canagliflozin Take 300 mg by mouth daily.   ipratropium-albuterol 0.5-2.5 (3) MG/3ML Soln Commonly known as:  DUONEB Take 3 mLs by nebulization every 6 (six) hours as needed (for shortness of breath).   loperamide 2 MG capsule Commonly known as:  IMODIUM Take by mouth as needed for diarrhea or loose stools.   omeprazole 40 MG capsule Commonly known as:  PRILOSEC Take 40 mg by mouth 2 (two) times daily.   polyethylene glycol packet Commonly known as:  MIRALAX / GLYCOLAX Take 17 g by mouth daily as needed for moderate constipation.   QUEtiapine 25 MG tablet Commonly known as:  SEROQUEL Take 25 mg by mouth 2 (two) times daily.   traMADol 50 MG tablet Commonly known as:  ULTRAM Take 50 mg by mouth 2 (two) times daily as needed for moderate pain.   TRULICITY 1.5 0000000 Sopn Generic drug:  Dulaglutide Inject 1.5 mg into the skin every Thursday.      Follow-up Information    DEVESHWAR, Fritz Pickerel, MD Follow up in 2 week(s).   Specialty:  Interventional Radiology Why:  call [phone] if questions or concerns; 2 week follow up appt Contact information: [address] [place] [address] [phone]            Electronically Signed: Monia Sabal A 05/25/2016, 9:48 AM   I have spent Greater Than 30 Minutes discharging Lawerance Bach.

## 2016-05-25 NOTE — Progress Notes (Signed)
0500 BP above 160 parameter and no PRN medication ordered. eLink Dr called and notified. New order of 10mg  hydralazine IV Q 4hr PRN. Will continue to monitor.

## 2016-05-28 ENCOUNTER — Encounter (HOSPITAL_COMMUNITY): Payer: Self-pay | Admitting: Interventional Radiology

## 2016-05-31 NOTE — Telephone Encounter (Signed)
Pt is calling about the results. She said her daughter probably forgot to tell her since she was in the hospital at that time. Please call her at [phone]

## 2016-06-01 NOTE — Telephone Encounter (Signed)
LMVM for pt that returned her call about doppler results.   To call back next week at her convenience.

## 2016-06-04 NOTE — Telephone Encounter (Signed)
Rn call patients home number about the doppler results. Pts daughter Jill Taylor pick up the phone. Rn stated her mom call about the doppler results. Rn stated her mom left message that no one told her about the results. [name] stated her mom was not feeling well today and was sleep. [name] stated she will give her mom the results when she wakes up.

## 2016-06-05 ENCOUNTER — Encounter (HOSPITAL_COMMUNITY): Payer: Self-pay | Admitting: Interventional Radiology

## 2016-06-18 ENCOUNTER — Ambulatory Visit (HOSPITAL_COMMUNITY)
Admission: RE | Admit: 2016-06-18 | Discharge: 2016-06-18 | Disposition: A | Discharge status: Home / Self Care | Payer: Medicare Other | Source: Ambulatory Visit | Attending: Radiology | Admitting: Radiology

## 2016-06-18 DIAGNOSIS — I6602 Occlusion and stenosis of left middle cerebral artery: Secondary | ICD-10-CM

## 2016-06-18 HISTORY — PX: IR GENERIC HISTORICAL: IMG1180011

## 2016-06-19 ENCOUNTER — Encounter (HOSPITAL_COMMUNITY): Payer: Self-pay | Admitting: Interventional Radiology

## 2016-11-06 ENCOUNTER — Ambulatory Visit: Payer: Self-pay | Admitting: Neurology

## 2016-12-26 ENCOUNTER — Telehealth (HOSPITAL_COMMUNITY): Payer: Self-pay

## 2016-12-26 NOTE — Telephone Encounter (Signed)
Called to schedule 6 month f/u mri. Pt does not want to schedule at the moment. She has a lot going on right now. AW

## 2018-01-02 IMAGING — MR MR HEAD W/O CM
8 of 10 series · 35 of 48 positions shown · non-contrast
Comparison: CT and CTA HEAD March 05, 2016 and MRI head November 17, 2014

CLINICAL DATA: Intermittent expressive aphasia, now worsening.
Status post LEFT internal carotid artery balloon angioplasty February 09, 2015. History of aneurysm clipping, stroke, headache,
hypertension, diabetes, hyperlipidemia.

EXAM:
MRI HEAD WITHOUT CONTRAST
TECHNIQUE: Multiplanar, multiecho pulse sequences of the brain and surrounding
structures were obtained without intravenous contrast.

[Series 3: DWI · axial · 3.0mm · 1.09mm/px · z∈[-44,+88]mm · 9 of 90 slices shown (1 of 4)]
[im 1/90]
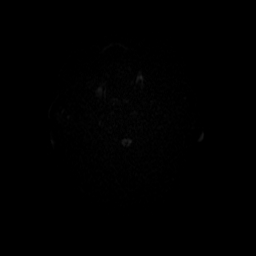
[im 12/90]
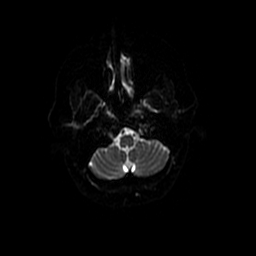
[im 23/90]
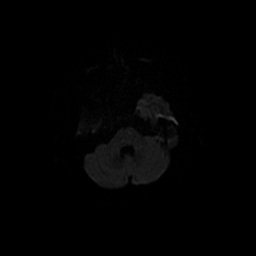
[im 34/90]
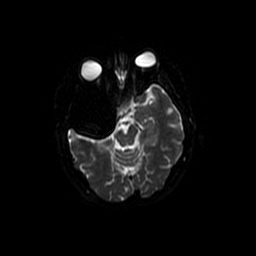
[im 45/90]
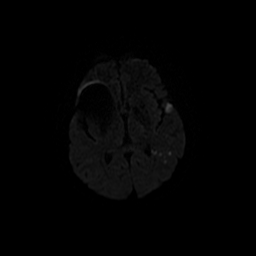
[im 56/90]
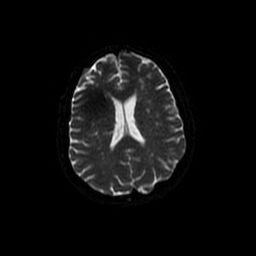
[im 67/90]
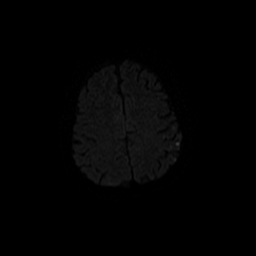
[im 78/90]
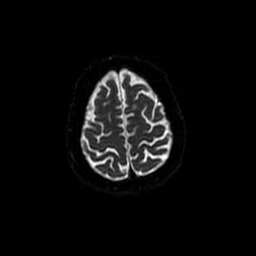
[im 90/90]
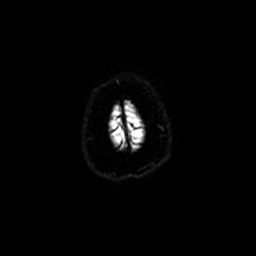

[Series 4: T1 · sagittal · 5.0mm · 0.47mm/px · 2 of 23 slices shown]
[im 1/23]
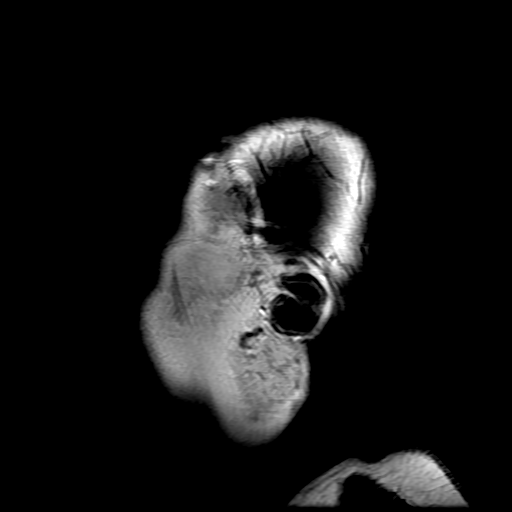
[im 23/23]
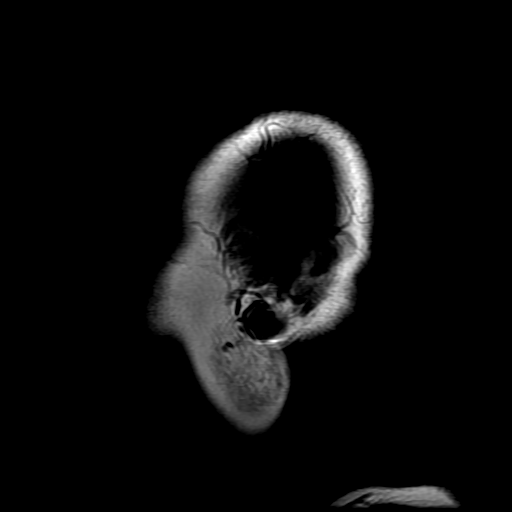

[Series 5: T2 · axial · 5.0mm · 0.43mm/px · z∈[-41,+91]mm · 3 of 23 slices shown (1 of 2)]
[im 1/23]
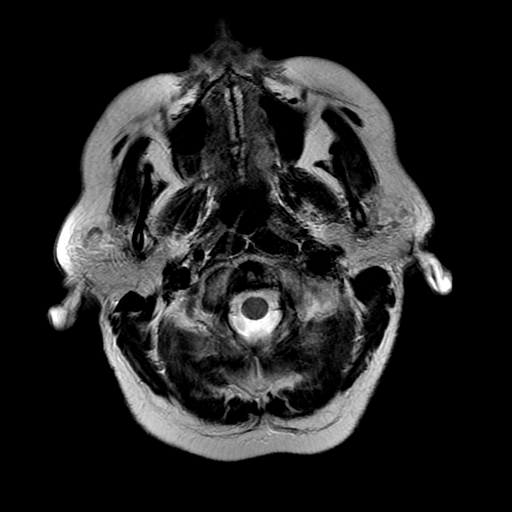
[im 12/23]
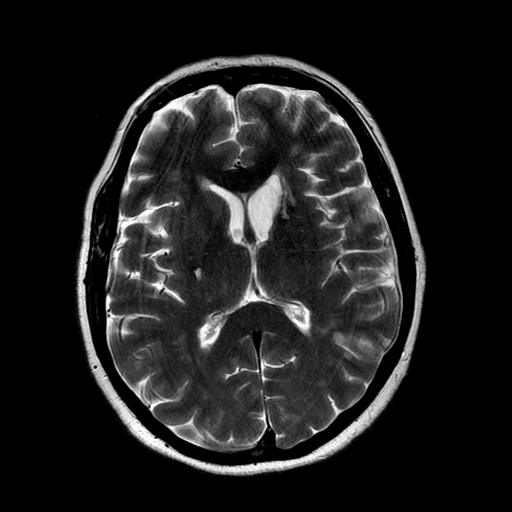
[im 23/23]
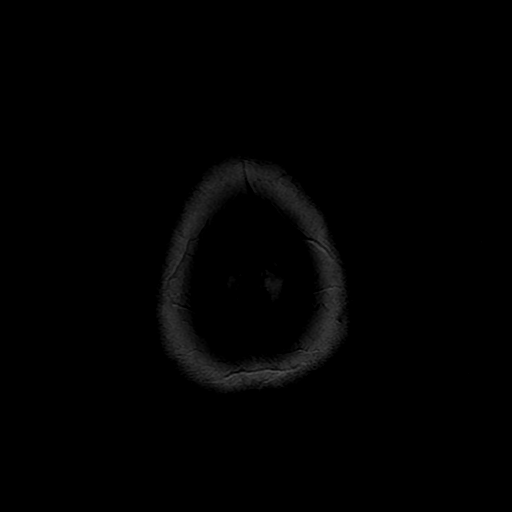

[Series 6: DWI · coronal · 5.0mm · 1.09mm/px · 7 of 62 slices shown (2 of 4)]
[im 1/62]
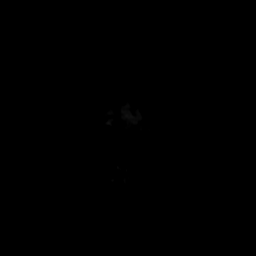
[im 11/62]
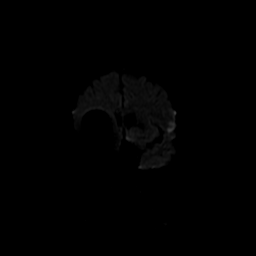
[im 21/62]
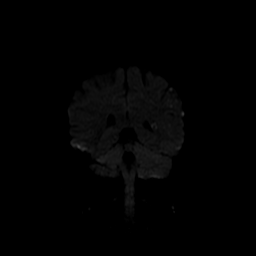
[im 31/62]
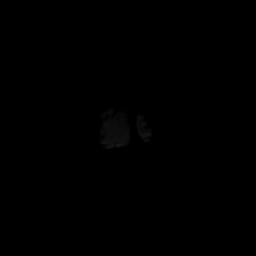
[im 41/62]
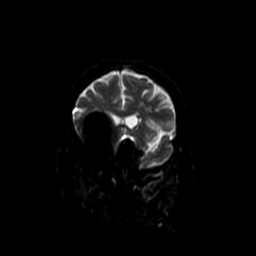
[im 51/62]
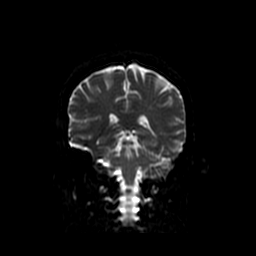
[im 62/62]
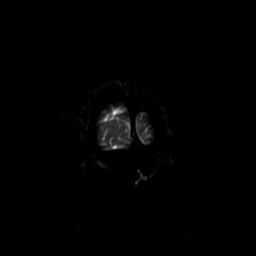

[Series 7: FLAIR · axial · 5.0mm · 0.43mm/px · z∈[-41,+91]mm · 3 of 23 slices shown]
[im 1/23]
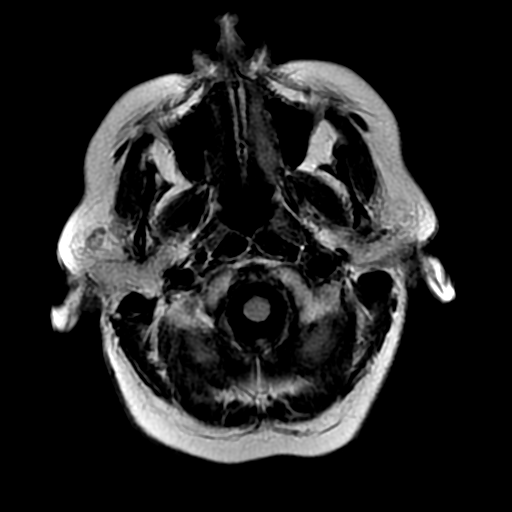
[im 12/23]
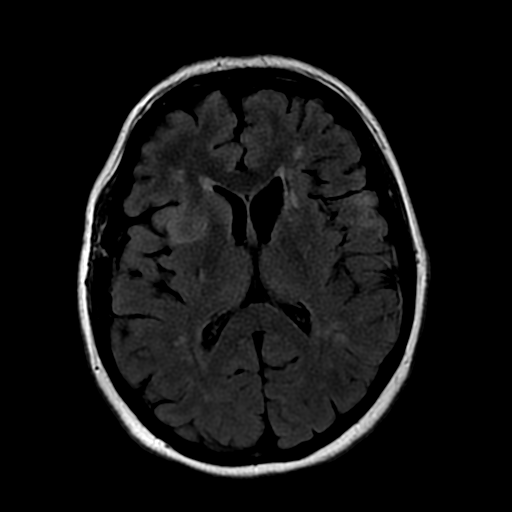
[im 23/23]
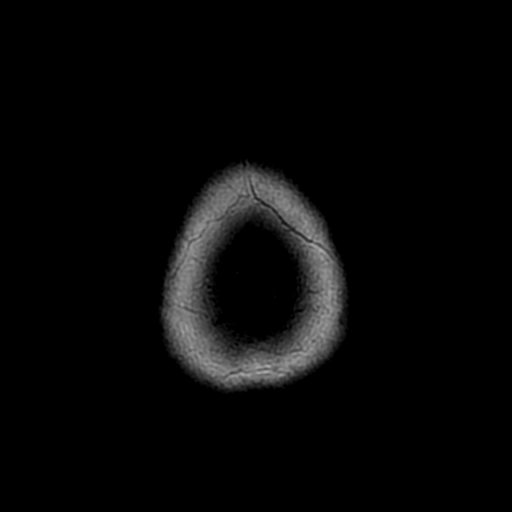

[Series 10: T2 · coronal · 5.0mm · 0.39mm/px · 3 of 24 slices shown (2 of 2)]
[im 1/24]
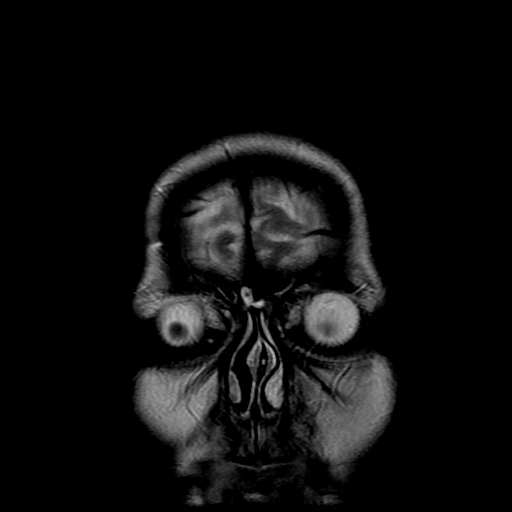
[im 12/24]
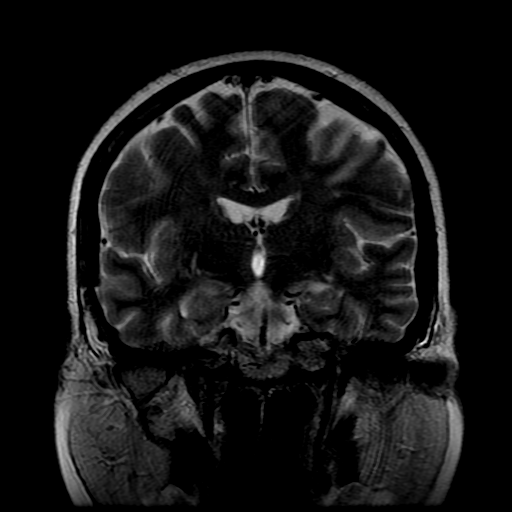
[im 24/24]
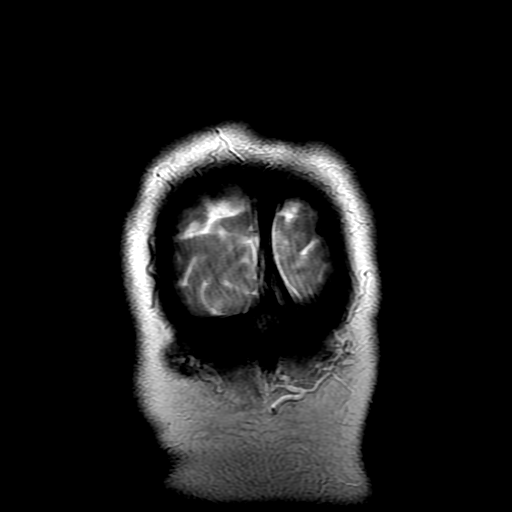

[Series 300: DWI · axial · 3.0mm · 1.09mm/px · z∈[-44,+88]mm · 5 of 45 slices shown (3 of 4)]
[im 1/45]
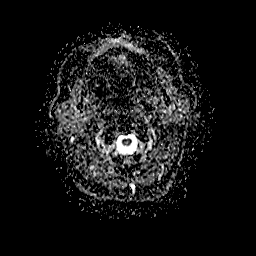
[im 12/45]
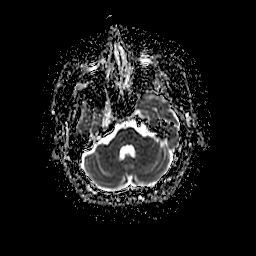
[im 23/45]
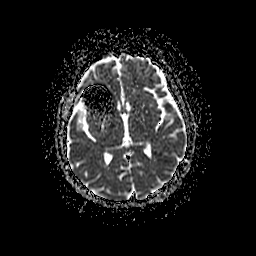
[im 34/45]
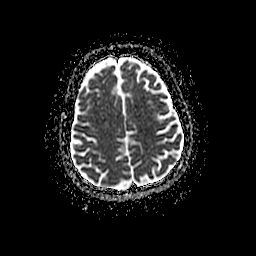
[im 45/45]
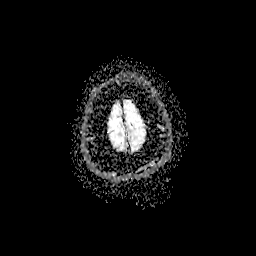

[Series 600: DWI · coronal · 5.0mm · 1.09mm/px · 3 of 31 slices shown (4 of 4)]
[im 1/31]
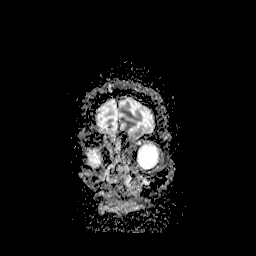
[im 16/31]
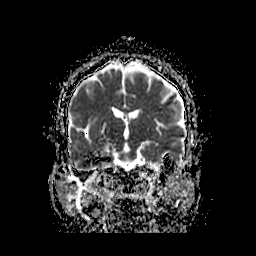
[im 31/31]
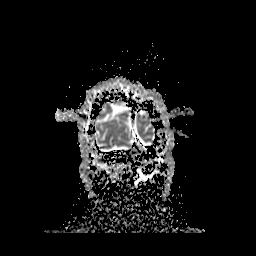

[35 of 48 positions shown; findings below may reference images not displayed]

FINDINGS: INTRACRANIAL CONTENTS: Patchy reduced diffusion LEFT frontotemporal
parietal lobes. Patchy low ADC values in the larger more
identifiable areas. No susceptibility artifact to suggest
hemorrhage. Extensive susceptibility artifact associated with the
RIGHT middle cerebral artery bifurcation aneurysm clipping. Old
bilateral basal ganglia infarcts with mild ex vacuo patient LEFT
frontal horn of the lateral ventricle. Small area LEFT frontal lobe
encephalomalacia. Patchy white matter T2 hyperintensities exclusive
of the aforementioned abnormality. No abnormal extra-axial fluid
collections. Major intracranial vascular flow voids present at skull
base.

ORBITS: The included ocular globes and orbital contents are
non-suspicious.

SINUSES: Mild paranasal sinus mucosal thickening. Mastoid air cells
are well aerated.

SKULL/SOFT TISSUES: No abnormal sellar expansion. No suspicious
calvarial bone marrow signal. Craniocervical junction maintained.
Susceptibility artifact RIGHT calvarium corresponding to craniotomy.
Patient is edentulous.
IMPRESSION: Multiple acute and subacute nonhemorrhagic infarcts in LEFT MCA
territory.

Old small LEFT MCA territory infarcts. Moderate chronic small vessel
ischemic disease and old RIGHT basal ganglia lacunar infarct.

Status post RIGHT craniotomy for MCA aneurysm clipping.

## 2018-01-12 IMAGING — XA IR VERTEBRAL  SELECTIVE UNILAT RIGHT (MS)
2 series · 3 of 3 positions shown · IV contrast (IODINE)
Comparison: none

CLINICAL DATA: Left middle cerebral artery distribution ischemic
strokes. Past history of left middle cerebral artery balloon
angioplasty.

[Series 3: carotid · 2 of 2 slices shown (1 of 2)]
[im 1/2]
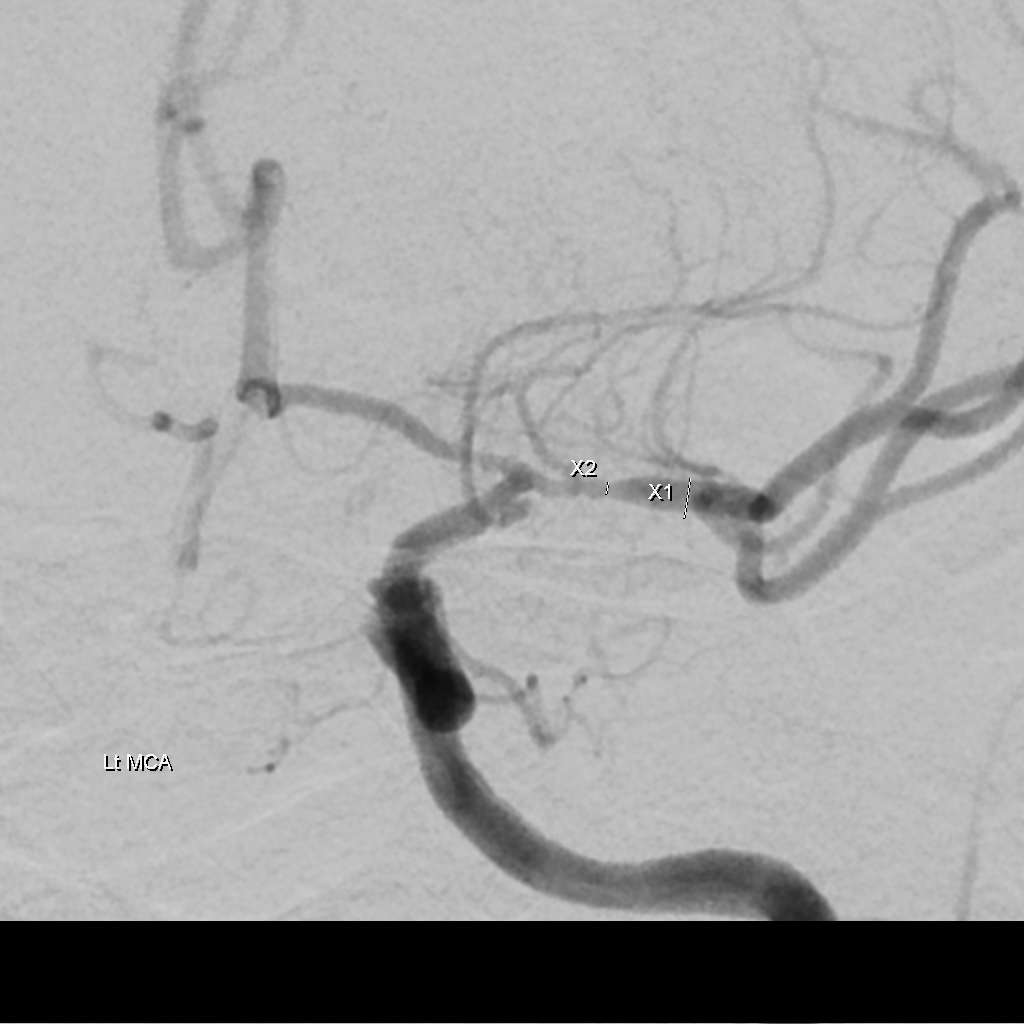
[im 2/2]
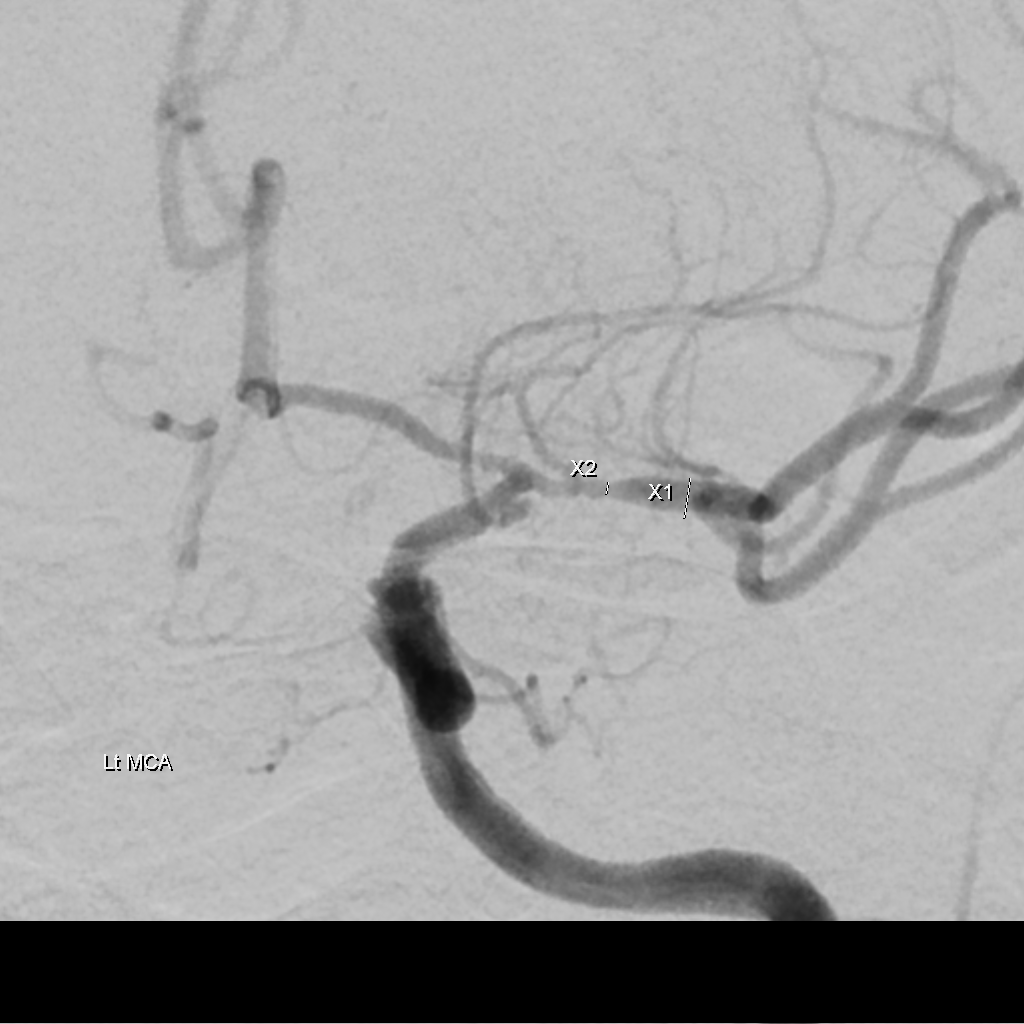

[Series 12: carotid · 1 of 1 slices shown (2 of 2)]
[im 1/1]
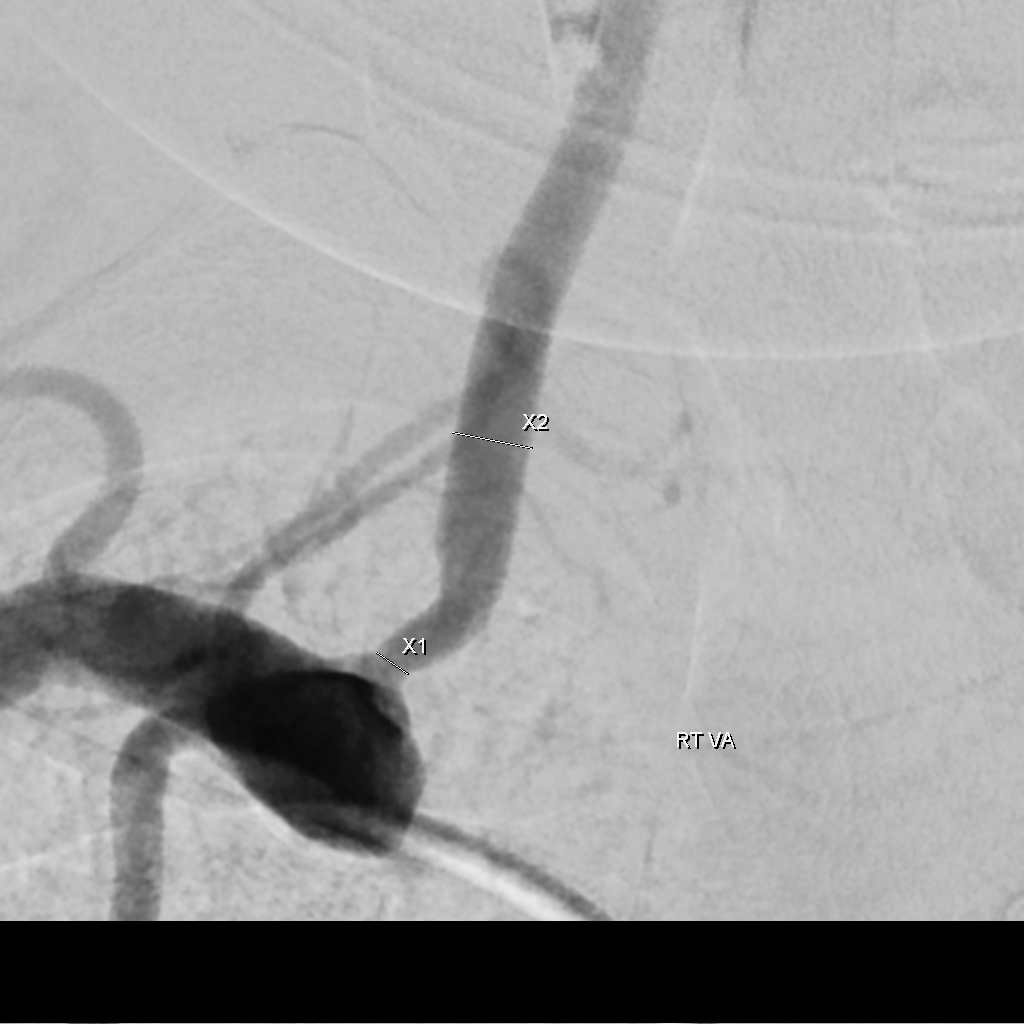

[3 of 3 positions shown; findings below may reference images not displayed]

EXAM:
BILATERAL COMMON CAROTID AND INNOMINATE ANGIOGRAPHY AND RIGHT
VERTEBRAL ARTERY ANGIOGRAMS

PROCEDURE:
Contrast: 60 mL [id number] IOPAMIDOL ([id number]) INJECTION 61%

Anesthesia/Sedation:  Conscious sedation.

Medications: Versed 1 mg IV. Fentanyl 25 mcg IV. Heparin 0666 units
IV.

Following a full explanation of the procedure along with the
potential associated complications, an informed witnessed consent
was obtained.

The right groin was prepped and draped in the usual sterile fashion.
Thereafter using modified Seldinger technique, transfemoral access
into the right common femoral artery was obtained without
difficulty. Over a 0.035 inch guidewire, a 5 French Pinnacle sheath
was inserted. Through this, and also over 0.035 inch guidewire, a 5
French JB 1 catheter was advanced to the aortic arch region and
selectively positioned in the right common carotid artery, the right
vertebral artery, the left common carotid artery.

There were no acute complications. The patient tolerated the
procedure well.
FINDINGS: The left common carotid arteriogram demonstrates the left external
carotid artery and its major branches to be widely patent.

The left internal carotid artery at the bulb has a smooth shallow
plaque and also just distal to it. There is less than 10% stenosis
by the NASCET criteria. No acute ulcerations were seen.

The vessel, otherwise, is seen to opacify normally to the cranial
skull base. The petrous and cavernous segments are widely patent.

There is a high-grade focal stenosis of the left internal carotid
artery supraclinoid segment which extends into the proximal left
middle cerebral artery of 75-80%.

The left anterior cerebral artery proximally also has a focal area
of mild-to-moderate stenosis. More distally the left anterior
cerebral artery branches are normally opacified.

The left middle cerebral artery opacifies to the trifurcation
branches which are widely patent.

A prominent anterior choroidal artery is seen arising from the
posterior aspect of the left internal carotid artery supraclinoid
segment proximally.

The right common carotid arteriogram demonstrates approximately 50%
stenosis of the right external carotid artery at its origin. Its
branches are normally opacified. The right internal carotid artery
at the bulb to the cranial skull base opacifies normally.

There is mild stenosis of the proximal cavernous segment of the
right internal carotid artery. More distally the cavernous and
supraclinoid segments are widely patent.

The right middle cerebral artery and the right anterior cerebral
artery opacify normally into the capillary and venous phases. Cross
opacification via the anterior communicating artery of the left
anterior cerebral A2 segment is seen. Again demonstrated are
multiple surgical clips overlying the right middle cerebral artery
bifurcation region related to a previous clipping of an aneurysm. No
angiographic evidence of recurrence or residual aneurysm is seen in
this region.

The right vertebral artery origin has a 50-60% stenosis. This
vessel, however, opacifies to the cranial skull base. Normal
opacification is seen of the right posterior-inferior cerebellar
artery and the right vertebrobasilar junction.

The opacified portion of the basilar artery, the posterior cerebral
arteries, superior cerebellar arteries and the anterior-inferior
cerebellar arteries is grossly normal into the delayed arterial and
venous phases. Non-opacified blood is seen in the basilar artery
from the contralateral vertebral artery.
IMPRESSION: High-grade 75-80% stenosis of the left middle cerebral artery
proximally at its origin, with a high-grade stenosis of the left
internal carotid artery supraclinoid segment.

50-60% stenosis of the origin of the right vertebral artery.

The angiographic findings were reviewed with the patient and the
patient's family. Given the patient's failure on aspirin and Plavix,
the option of endovascular revascularization with the aid of
angioplasty with or without stenting of the left middle cerebral
artery was again discussed with the patient and the patient's
husband. Questions were answered. They are in agreement to proceed
with the endovascular treatment of this symptomatic high-grade
stenosis of the left middle cerebral artery proximally. This will be
scheduled at the earliest possible under general anesthesia. In the
meantime, the patient has been asked to stop smoking and to continue
taking her aspirin and Plavix. Questions were answered to their
satisfaction. They were asked to call should they have any concerns
or questions.

## 2018-01-23 NOTE — Telephone Encounter (Signed)
Signing past encounter of attempted phone call 

## 2019-05-12 ENCOUNTER — Encounter: Payer: Medicare Other | Admitting: Vascular Surgery

## 2019-06-09 ENCOUNTER — Encounter: Payer: Medicare Other | Admitting: Vascular Surgery

## 2019-06-09 ENCOUNTER — Encounter: Payer: Self-pay | Admitting: Family

## 2019-10-02 DIAGNOSIS — I739 Peripheral vascular disease, unspecified: Secondary | ICD-10-CM | POA: Diagnosis not present

## 2019-10-02 DIAGNOSIS — Z95828 Presence of other vascular implants and grafts: Secondary | ICD-10-CM | POA: Diagnosis not present

## 2019-10-02 DIAGNOSIS — Z7982 Long term (current) use of aspirin: Secondary | ICD-10-CM | POA: Diagnosis not present

## 2019-10-02 DIAGNOSIS — R200 Anesthesia of skin: Secondary | ICD-10-CM | POA: Diagnosis not present

## 2019-10-02 DIAGNOSIS — I70222 Atherosclerosis of native arteries of extremities with rest pain, left leg: Secondary | ICD-10-CM | POA: Diagnosis not present

## 2019-10-06 DIAGNOSIS — Z01812 Encounter for preprocedural laboratory examination: Secondary | ICD-10-CM | POA: Diagnosis not present

## 2019-10-06 DIAGNOSIS — I739 Peripheral vascular disease, unspecified: Secondary | ICD-10-CM | POA: Diagnosis not present

## 2019-10-12 DIAGNOSIS — Z95820 Peripheral vascular angioplasty status with implants and grafts: Secondary | ICD-10-CM | POA: Diagnosis not present

## 2019-10-12 DIAGNOSIS — E1151 Type 2 diabetes mellitus with diabetic peripheral angiopathy without gangrene: Secondary | ICD-10-CM | POA: Diagnosis not present

## 2019-10-12 DIAGNOSIS — I2510 Atherosclerotic heart disease of native coronary artery without angina pectoris: Secondary | ICD-10-CM | POA: Diagnosis not present

## 2019-10-12 DIAGNOSIS — I6529 Occlusion and stenosis of unspecified carotid artery: Secondary | ICD-10-CM | POA: Diagnosis not present

## 2019-10-12 DIAGNOSIS — Z7902 Long term (current) use of antithrombotics/antiplatelets: Secondary | ICD-10-CM | POA: Diagnosis not present

## 2019-10-12 DIAGNOSIS — T82858A Stenosis of vascular prosthetic devices, implants and grafts, initial encounter: Secondary | ICD-10-CM | POA: Diagnosis not present

## 2019-10-12 DIAGNOSIS — I70212 Atherosclerosis of native arteries of extremities with intermittent claudication, left leg: Secondary | ICD-10-CM | POA: Diagnosis not present

## 2019-10-12 DIAGNOSIS — I739 Peripheral vascular disease, unspecified: Secondary | ICD-10-CM | POA: Diagnosis not present

## 2019-11-17 DIAGNOSIS — I739 Peripheral vascular disease, unspecified: Secondary | ICD-10-CM | POA: Diagnosis not present

## 2019-11-17 DIAGNOSIS — Z95828 Presence of other vascular implants and grafts: Secondary | ICD-10-CM | POA: Diagnosis not present

## 2019-11-26 DIAGNOSIS — E113213 Type 2 diabetes mellitus with mild nonproliferative diabetic retinopathy with macular edema, bilateral: Secondary | ICD-10-CM | POA: Diagnosis not present

## 2019-12-07 DIAGNOSIS — H524 Presbyopia: Secondary | ICD-10-CM | POA: Diagnosis not present

## 2019-12-07 DIAGNOSIS — H5213 Myopia, bilateral: Secondary | ICD-10-CM | POA: Diagnosis not present

## 2019-12-20 DIAGNOSIS — I639 Cerebral infarction, unspecified: Secondary | ICD-10-CM | POA: Diagnosis not present

## 2019-12-20 DIAGNOSIS — K219 Gastro-esophageal reflux disease without esophagitis: Secondary | ICD-10-CM | POA: Diagnosis not present

## 2019-12-20 DIAGNOSIS — M1990 Unspecified osteoarthritis, unspecified site: Secondary | ICD-10-CM | POA: Diagnosis not present

## 2019-12-20 DIAGNOSIS — I252 Old myocardial infarction: Secondary | ICD-10-CM | POA: Diagnosis not present

## 2019-12-20 DIAGNOSIS — E785 Hyperlipidemia, unspecified: Secondary | ICD-10-CM | POA: Diagnosis not present

## 2019-12-20 DIAGNOSIS — I10 Essential (primary) hypertension: Secondary | ICD-10-CM | POA: Diagnosis not present

## 2019-12-20 DIAGNOSIS — D72829 Elevated white blood cell count, unspecified: Secondary | ICD-10-CM | POA: Diagnosis not present

## 2019-12-20 DIAGNOSIS — Z888 Allergy status to other drugs, medicaments and biological substances status: Secondary | ICD-10-CM | POA: Diagnosis not present

## 2019-12-20 DIAGNOSIS — Z885 Allergy status to narcotic agent status: Secondary | ICD-10-CM | POA: Diagnosis not present

## 2019-12-20 DIAGNOSIS — I674 Hypertensive encephalopathy: Secondary | ICD-10-CM | POA: Diagnosis not present

## 2019-12-20 DIAGNOSIS — G459 Transient cerebral ischemic attack, unspecified: Secondary | ICD-10-CM | POA: Diagnosis not present

## 2019-12-20 DIAGNOSIS — M109 Gout, unspecified: Secondary | ICD-10-CM | POA: Diagnosis not present

## 2019-12-20 DIAGNOSIS — Z8673 Personal history of transient ischemic attack (TIA), and cerebral infarction without residual deficits: Secondary | ICD-10-CM | POA: Diagnosis not present

## 2019-12-20 DIAGNOSIS — I2510 Atherosclerotic heart disease of native coronary artery without angina pectoris: Secondary | ICD-10-CM | POA: Diagnosis not present

## 2019-12-20 DIAGNOSIS — E118 Type 2 diabetes mellitus with unspecified complications: Secondary | ICD-10-CM | POA: Diagnosis not present

## 2019-12-20 DIAGNOSIS — E119 Type 2 diabetes mellitus without complications: Secondary | ICD-10-CM | POA: Diagnosis not present

## 2019-12-20 DIAGNOSIS — Z79899 Other long term (current) drug therapy: Secondary | ICD-10-CM | POA: Diagnosis not present

## 2019-12-20 DIAGNOSIS — R200 Anesthesia of skin: Secondary | ICD-10-CM | POA: Diagnosis not present

## 2019-12-20 DIAGNOSIS — Z7982 Long term (current) use of aspirin: Secondary | ICD-10-CM | POA: Diagnosis not present

## 2019-12-20 DIAGNOSIS — R519 Headache, unspecified: Secondary | ICD-10-CM | POA: Diagnosis not present

## 2019-12-20 DIAGNOSIS — J449 Chronic obstructive pulmonary disease, unspecified: Secondary | ICD-10-CM | POA: Diagnosis not present

## 2019-12-20 DIAGNOSIS — R479 Unspecified speech disturbances: Secondary | ICD-10-CM | POA: Diagnosis not present

## 2019-12-20 DIAGNOSIS — Z882 Allergy status to sulfonamides status: Secondary | ICD-10-CM | POA: Diagnosis not present

## 2019-12-21 DIAGNOSIS — I639 Cerebral infarction, unspecified: Secondary | ICD-10-CM | POA: Diagnosis not present

## 2019-12-21 DIAGNOSIS — R200 Anesthesia of skin: Secondary | ICD-10-CM | POA: Diagnosis not present

## 2019-12-21 DIAGNOSIS — I6523 Occlusion and stenosis of bilateral carotid arteries: Secondary | ICD-10-CM | POA: Diagnosis not present

## 2019-12-21 DIAGNOSIS — R519 Headache, unspecified: Secondary | ICD-10-CM | POA: Diagnosis not present

## 2019-12-21 DIAGNOSIS — E118 Type 2 diabetes mellitus with unspecified complications: Secondary | ICD-10-CM | POA: Diagnosis not present

## 2019-12-21 DIAGNOSIS — I10 Essential (primary) hypertension: Secondary | ICD-10-CM | POA: Diagnosis not present

## 2020-01-04 DIAGNOSIS — Z794 Long term (current) use of insulin: Secondary | ICD-10-CM | POA: Diagnosis not present

## 2020-01-04 DIAGNOSIS — E119 Type 2 diabetes mellitus without complications: Secondary | ICD-10-CM | POA: Diagnosis not present

## 2020-01-04 DIAGNOSIS — Z09 Encounter for follow-up examination after completed treatment for conditions other than malignant neoplasm: Secondary | ICD-10-CM | POA: Diagnosis not present

## 2020-01-04 DIAGNOSIS — R8290 Unspecified abnormal findings in urine: Secondary | ICD-10-CM | POA: Diagnosis not present

## 2020-01-04 DIAGNOSIS — E079 Disorder of thyroid, unspecified: Secondary | ICD-10-CM | POA: Diagnosis not present

## 2020-01-04 DIAGNOSIS — Z1159 Encounter for screening for other viral diseases: Secondary | ICD-10-CM | POA: Diagnosis not present

## 2020-01-04 DIAGNOSIS — Z0000 Encounter for general adult medical examination without abnormal findings: Secondary | ICD-10-CM | POA: Diagnosis not present

## 2020-01-04 DIAGNOSIS — N390 Urinary tract infection, site not specified: Secondary | ICD-10-CM | POA: Diagnosis not present

## 2020-01-04 DIAGNOSIS — E1165 Type 2 diabetes mellitus with hyperglycemia: Secondary | ICD-10-CM | POA: Diagnosis not present

## 2020-01-25 DIAGNOSIS — Z1231 Encounter for screening mammogram for malignant neoplasm of breast: Secondary | ICD-10-CM | POA: Diagnosis not present

## 2020-02-18 DIAGNOSIS — E119 Type 2 diabetes mellitus without complications: Secondary | ICD-10-CM | POA: Diagnosis not present

## 2020-02-18 DIAGNOSIS — I779 Disorder of arteries and arterioles, unspecified: Secondary | ICD-10-CM | POA: Diagnosis not present

## 2020-02-18 DIAGNOSIS — T82898A Other specified complication of vascular prosthetic devices, implants and grafts, initial encounter: Secondary | ICD-10-CM | POA: Diagnosis not present

## 2020-02-18 DIAGNOSIS — E782 Mixed hyperlipidemia: Secondary | ICD-10-CM | POA: Diagnosis not present

## 2020-02-18 DIAGNOSIS — I10 Essential (primary) hypertension: Secondary | ICD-10-CM | POA: Diagnosis not present

## 2020-02-18 DIAGNOSIS — I739 Peripheral vascular disease, unspecified: Secondary | ICD-10-CM | POA: Diagnosis not present

## 2020-02-19 DIAGNOSIS — H1032 Unspecified acute conjunctivitis, left eye: Secondary | ICD-10-CM | POA: Diagnosis not present

## 2020-02-29 DIAGNOSIS — Z20822 Contact with and (suspected) exposure to covid-19: Secondary | ICD-10-CM | POA: Diagnosis not present

## 2020-02-29 DIAGNOSIS — I739 Peripheral vascular disease, unspecified: Secondary | ICD-10-CM | POA: Diagnosis not present

## 2020-02-29 DIAGNOSIS — Z01812 Encounter for preprocedural laboratory examination: Secondary | ICD-10-CM | POA: Diagnosis not present

## 2020-03-04 DIAGNOSIS — Z7902 Long term (current) use of antithrombotics/antiplatelets: Secondary | ICD-10-CM | POA: Diagnosis not present

## 2020-03-04 DIAGNOSIS — E1151 Type 2 diabetes mellitus with diabetic peripheral angiopathy without gangrene: Secondary | ICD-10-CM | POA: Diagnosis not present

## 2020-03-04 DIAGNOSIS — Z7982 Long term (current) use of aspirin: Secondary | ICD-10-CM | POA: Diagnosis not present

## 2020-03-04 DIAGNOSIS — Z8673 Personal history of transient ischemic attack (TIA), and cerebral infarction without residual deficits: Secondary | ICD-10-CM | POA: Diagnosis not present

## 2020-03-04 DIAGNOSIS — I745 Embolism and thrombosis of iliac artery: Secondary | ICD-10-CM | POA: Diagnosis not present

## 2020-03-04 DIAGNOSIS — E782 Mixed hyperlipidemia: Secondary | ICD-10-CM | POA: Diagnosis not present

## 2020-03-04 DIAGNOSIS — I10 Essential (primary) hypertension: Secondary | ICD-10-CM | POA: Diagnosis not present

## 2020-03-04 DIAGNOSIS — E1142 Type 2 diabetes mellitus with diabetic polyneuropathy: Secondary | ICD-10-CM | POA: Diagnosis not present

## 2020-03-04 DIAGNOSIS — M79662 Pain in left lower leg: Secondary | ICD-10-CM | POA: Diagnosis not present

## 2020-03-04 DIAGNOSIS — Z95820 Peripheral vascular angioplasty status with implants and grafts: Secondary | ICD-10-CM | POA: Diagnosis not present

## 2020-03-04 DIAGNOSIS — I70222 Atherosclerosis of native arteries of extremities with rest pain, left leg: Secondary | ICD-10-CM | POA: Diagnosis not present

## 2020-03-04 DIAGNOSIS — I70212 Atherosclerosis of native arteries of extremities with intermittent claudication, left leg: Secondary | ICD-10-CM | POA: Diagnosis not present

## 2020-03-04 DIAGNOSIS — I70202 Unspecified atherosclerosis of native arteries of extremities, left leg: Secondary | ICD-10-CM | POA: Diagnosis not present

## 2020-03-04 DIAGNOSIS — I671 Cerebral aneurysm, nonruptured: Secondary | ICD-10-CM | POA: Diagnosis not present

## 2020-03-25 DIAGNOSIS — R05 Cough: Secondary | ICD-10-CM | POA: Diagnosis not present

## 2020-03-25 DIAGNOSIS — R509 Fever, unspecified: Secondary | ICD-10-CM | POA: Diagnosis not present

## 2020-03-25 DIAGNOSIS — R438 Other disturbances of smell and taste: Secondary | ICD-10-CM | POA: Diagnosis not present

## 2020-03-25 DIAGNOSIS — J029 Acute pharyngitis, unspecified: Secondary | ICD-10-CM | POA: Diagnosis not present

## 2020-03-25 DIAGNOSIS — R062 Wheezing: Secondary | ICD-10-CM | POA: Diagnosis not present

## 2020-03-25 DIAGNOSIS — Z20822 Contact with and (suspected) exposure to covid-19: Secondary | ICD-10-CM | POA: Diagnosis not present

## 2020-03-25 DIAGNOSIS — R0602 Shortness of breath: Secondary | ICD-10-CM | POA: Diagnosis not present

## 2020-04-05 DIAGNOSIS — Z79899 Other long term (current) drug therapy: Secondary | ICD-10-CM | POA: Diagnosis not present

## 2020-04-05 DIAGNOSIS — Z23 Encounter for immunization: Secondary | ICD-10-CM | POA: Diagnosis not present

## 2020-04-05 DIAGNOSIS — E1165 Type 2 diabetes mellitus with hyperglycemia: Secondary | ICD-10-CM | POA: Diagnosis not present

## 2020-04-05 DIAGNOSIS — I739 Peripheral vascular disease, unspecified: Secondary | ICD-10-CM | POA: Diagnosis not present

## 2020-04-05 DIAGNOSIS — Z794 Long term (current) use of insulin: Secondary | ICD-10-CM | POA: Diagnosis not present

## 2020-04-05 DIAGNOSIS — Z0184 Encounter for antibody response examination: Secondary | ICD-10-CM | POA: Diagnosis not present

## 2020-04-05 DIAGNOSIS — E079 Disorder of thyroid, unspecified: Secondary | ICD-10-CM | POA: Diagnosis not present

## 2020-04-26 DIAGNOSIS — T82856D Stenosis of peripheral vascular stent, subsequent encounter: Secondary | ICD-10-CM | POA: Diagnosis not present

## 2020-04-26 DIAGNOSIS — F17210 Nicotine dependence, cigarettes, uncomplicated: Secondary | ICD-10-CM | POA: Diagnosis not present

## 2020-04-26 DIAGNOSIS — I70212 Atherosclerosis of native arteries of extremities with intermittent claudication, left leg: Secondary | ICD-10-CM | POA: Diagnosis not present

## 2020-04-26 DIAGNOSIS — I998 Other disorder of circulatory system: Secondary | ICD-10-CM | POA: Diagnosis not present

## 2020-04-26 DIAGNOSIS — I739 Peripheral vascular disease, unspecified: Secondary | ICD-10-CM | POA: Diagnosis not present

## 2020-04-26 DIAGNOSIS — Z01810 Encounter for preprocedural cardiovascular examination: Secondary | ICD-10-CM | POA: Diagnosis not present

## 2020-04-26 DIAGNOSIS — Z7982 Long term (current) use of aspirin: Secondary | ICD-10-CM | POA: Diagnosis not present

## 2020-05-12 DIAGNOSIS — I739 Peripheral vascular disease, unspecified: Secondary | ICD-10-CM | POA: Diagnosis not present

## 2020-05-12 DIAGNOSIS — Z7902 Long term (current) use of antithrombotics/antiplatelets: Secondary | ICD-10-CM | POA: Diagnosis not present

## 2020-05-12 DIAGNOSIS — E1165 Type 2 diabetes mellitus with hyperglycemia: Secondary | ICD-10-CM | POA: Diagnosis not present

## 2020-05-12 DIAGNOSIS — I10 Essential (primary) hypertension: Secondary | ICD-10-CM | POA: Diagnosis not present

## 2020-05-12 DIAGNOSIS — E1142 Type 2 diabetes mellitus with diabetic polyneuropathy: Secondary | ICD-10-CM | POA: Diagnosis not present

## 2020-05-12 DIAGNOSIS — F17210 Nicotine dependence, cigarettes, uncomplicated: Secondary | ICD-10-CM | POA: Diagnosis not present

## 2020-05-12 DIAGNOSIS — Z01818 Encounter for other preprocedural examination: Secondary | ICD-10-CM | POA: Diagnosis not present

## 2020-05-12 DIAGNOSIS — T402X5A Adverse effect of other opioids, initial encounter: Secondary | ICD-10-CM | POA: Diagnosis not present

## 2020-05-12 DIAGNOSIS — R509 Fever, unspecified: Secondary | ICD-10-CM | POA: Diagnosis not present

## 2020-05-12 DIAGNOSIS — Z8249 Family history of ischemic heart disease and other diseases of the circulatory system: Secondary | ICD-10-CM | POA: Diagnosis not present

## 2020-05-12 DIAGNOSIS — L732 Hidradenitis suppurativa: Secondary | ICD-10-CM | POA: Diagnosis not present

## 2020-05-12 DIAGNOSIS — J45909 Unspecified asthma, uncomplicated: Secondary | ICD-10-CM | POA: Diagnosis not present

## 2020-05-12 DIAGNOSIS — E039 Hypothyroidism, unspecified: Secondary | ICD-10-CM | POA: Diagnosis not present

## 2020-05-12 DIAGNOSIS — D62 Acute posthemorrhagic anemia: Secondary | ICD-10-CM | POA: Diagnosis not present

## 2020-05-12 DIAGNOSIS — E1151 Type 2 diabetes mellitus with diabetic peripheral angiopathy without gangrene: Secondary | ICD-10-CM | POA: Diagnosis not present

## 2020-05-12 DIAGNOSIS — D72829 Elevated white blood cell count, unspecified: Secondary | ICD-10-CM | POA: Diagnosis not present

## 2020-05-12 DIAGNOSIS — M797 Fibromyalgia: Secondary | ICD-10-CM | POA: Diagnosis not present

## 2020-05-12 DIAGNOSIS — I70222 Atherosclerosis of native arteries of extremities with rest pain, left leg: Secondary | ICD-10-CM | POA: Diagnosis not present

## 2020-05-12 DIAGNOSIS — Z8673 Personal history of transient ischemic attack (TIA), and cerebral infarction without residual deficits: Secondary | ICD-10-CM | POA: Diagnosis not present

## 2020-05-12 DIAGNOSIS — G43909 Migraine, unspecified, not intractable, without status migrainosus: Secondary | ICD-10-CM | POA: Diagnosis not present

## 2020-05-12 DIAGNOSIS — L298 Other pruritus: Secondary | ICD-10-CM | POA: Diagnosis not present

## 2020-05-12 DIAGNOSIS — R29810 Facial weakness: Secondary | ICD-10-CM | POA: Diagnosis not present

## 2020-05-12 DIAGNOSIS — Z7984 Long term (current) use of oral hypoglycemic drugs: Secondary | ICD-10-CM | POA: Diagnosis not present

## 2020-05-12 DIAGNOSIS — K219 Gastro-esophageal reflux disease without esophagitis: Secondary | ICD-10-CM | POA: Diagnosis not present

## 2020-05-12 DIAGNOSIS — E785 Hyperlipidemia, unspecified: Secondary | ICD-10-CM | POA: Diagnosis not present

## 2020-05-14 DIAGNOSIS — Z01818 Encounter for other preprocedural examination: Secondary | ICD-10-CM | POA: Diagnosis not present

## 2020-05-19 DIAGNOSIS — T402X5A Adverse effect of other opioids, initial encounter: Secondary | ICD-10-CM | POA: Diagnosis not present

## 2020-05-19 DIAGNOSIS — Z7984 Long term (current) use of oral hypoglycemic drugs: Secondary | ICD-10-CM | POA: Diagnosis not present

## 2020-05-19 DIAGNOSIS — M797 Fibromyalgia: Secondary | ICD-10-CM | POA: Diagnosis not present

## 2020-05-19 DIAGNOSIS — I10 Essential (primary) hypertension: Secondary | ICD-10-CM | POA: Diagnosis not present

## 2020-05-19 DIAGNOSIS — K219 Gastro-esophageal reflux disease without esophagitis: Secondary | ICD-10-CM | POA: Diagnosis not present

## 2020-05-19 DIAGNOSIS — E785 Hyperlipidemia, unspecified: Secondary | ICD-10-CM | POA: Diagnosis not present

## 2020-05-19 DIAGNOSIS — F17210 Nicotine dependence, cigarettes, uncomplicated: Secondary | ICD-10-CM | POA: Diagnosis not present

## 2020-05-19 DIAGNOSIS — I70229 Atherosclerosis of native arteries of extremities with rest pain, unspecified extremity: Secondary | ICD-10-CM | POA: Diagnosis not present

## 2020-05-19 DIAGNOSIS — E1142 Type 2 diabetes mellitus with diabetic polyneuropathy: Secondary | ICD-10-CM | POA: Diagnosis not present

## 2020-05-19 DIAGNOSIS — E1151 Type 2 diabetes mellitus with diabetic peripheral angiopathy without gangrene: Secondary | ICD-10-CM | POA: Diagnosis not present

## 2020-05-19 DIAGNOSIS — G43909 Migraine, unspecified, not intractable, without status migrainosus: Secondary | ICD-10-CM | POA: Diagnosis not present

## 2020-05-19 DIAGNOSIS — Z7902 Long term (current) use of antithrombotics/antiplatelets: Secondary | ICD-10-CM | POA: Diagnosis not present

## 2020-05-19 DIAGNOSIS — E1165 Type 2 diabetes mellitus with hyperglycemia: Secondary | ICD-10-CM | POA: Diagnosis not present

## 2020-05-19 DIAGNOSIS — E039 Hypothyroidism, unspecified: Secondary | ICD-10-CM | POA: Diagnosis not present

## 2020-05-19 DIAGNOSIS — Z720 Tobacco use: Secondary | ICD-10-CM | POA: Diagnosis not present

## 2020-05-19 DIAGNOSIS — J45909 Unspecified asthma, uncomplicated: Secondary | ICD-10-CM | POA: Diagnosis not present

## 2020-05-19 DIAGNOSIS — I70222 Atherosclerosis of native arteries of extremities with rest pain, left leg: Secondary | ICD-10-CM | POA: Diagnosis not present

## 2020-05-19 DIAGNOSIS — L732 Hidradenitis suppurativa: Secondary | ICD-10-CM | POA: Diagnosis not present

## 2020-05-19 DIAGNOSIS — D72829 Elevated white blood cell count, unspecified: Secondary | ICD-10-CM | POA: Diagnosis not present

## 2020-05-19 DIAGNOSIS — I739 Peripheral vascular disease, unspecified: Secondary | ICD-10-CM | POA: Diagnosis not present

## 2020-05-19 DIAGNOSIS — Z794 Long term (current) use of insulin: Secondary | ICD-10-CM | POA: Diagnosis not present

## 2020-05-19 DIAGNOSIS — R29810 Facial weakness: Secondary | ICD-10-CM | POA: Diagnosis not present

## 2020-05-19 DIAGNOSIS — L298 Other pruritus: Secondary | ICD-10-CM | POA: Diagnosis not present

## 2020-05-19 DIAGNOSIS — Z8673 Personal history of transient ischemic attack (TIA), and cerebral infarction without residual deficits: Secondary | ICD-10-CM | POA: Diagnosis not present

## 2020-05-19 DIAGNOSIS — R509 Fever, unspecified: Secondary | ICD-10-CM | POA: Diagnosis not present

## 2020-05-19 DIAGNOSIS — Z8249 Family history of ischemic heart disease and other diseases of the circulatory system: Secondary | ICD-10-CM | POA: Diagnosis not present

## 2020-05-19 DIAGNOSIS — D62 Acute posthemorrhagic anemia: Secondary | ICD-10-CM | POA: Diagnosis not present

## 2020-05-26 DIAGNOSIS — E039 Hypothyroidism, unspecified: Secondary | ICD-10-CM | POA: Diagnosis not present

## 2020-05-26 DIAGNOSIS — I10 Essential (primary) hypertension: Secondary | ICD-10-CM | POA: Diagnosis not present

## 2020-05-26 DIAGNOSIS — Z8673 Personal history of transient ischemic attack (TIA), and cerebral infarction without residual deficits: Secondary | ICD-10-CM | POA: Diagnosis not present

## 2020-05-26 DIAGNOSIS — J45909 Unspecified asthma, uncomplicated: Secondary | ICD-10-CM | POA: Diagnosis not present

## 2020-05-26 DIAGNOSIS — E785 Hyperlipidemia, unspecified: Secondary | ICD-10-CM | POA: Diagnosis not present

## 2020-05-26 DIAGNOSIS — E1151 Type 2 diabetes mellitus with diabetic peripheral angiopathy without gangrene: Secondary | ICD-10-CM | POA: Diagnosis not present

## 2020-05-26 DIAGNOSIS — E1142 Type 2 diabetes mellitus with diabetic polyneuropathy: Secondary | ICD-10-CM | POA: Diagnosis not present

## 2020-05-26 DIAGNOSIS — G4733 Obstructive sleep apnea (adult) (pediatric): Secondary | ICD-10-CM | POA: Diagnosis not present

## 2020-05-26 DIAGNOSIS — E1165 Type 2 diabetes mellitus with hyperglycemia: Secondary | ICD-10-CM | POA: Diagnosis not present

## 2020-05-26 DIAGNOSIS — Z7984 Long term (current) use of oral hypoglycemic drugs: Secondary | ICD-10-CM | POA: Diagnosis not present

## 2020-05-26 DIAGNOSIS — Z794 Long term (current) use of insulin: Secondary | ICD-10-CM | POA: Diagnosis not present

## 2020-05-26 DIAGNOSIS — F32A Depression, unspecified: Secondary | ICD-10-CM | POA: Diagnosis not present

## 2020-06-14 DIAGNOSIS — Z95820 Peripheral vascular angioplasty status with implants and grafts: Secondary | ICD-10-CM | POA: Diagnosis not present

## 2020-06-14 DIAGNOSIS — I739 Peripheral vascular disease, unspecified: Secondary | ICD-10-CM | POA: Diagnosis not present

## 2020-06-23 DIAGNOSIS — R1031 Right lower quadrant pain: Secondary | ICD-10-CM | POA: Diagnosis not present

## 2020-06-23 DIAGNOSIS — E1165 Type 2 diabetes mellitus with hyperglycemia: Secondary | ICD-10-CM | POA: Diagnosis not present

## 2020-06-23 DIAGNOSIS — Z794 Long term (current) use of insulin: Secondary | ICD-10-CM | POA: Diagnosis not present

## 2020-06-23 DIAGNOSIS — E785 Hyperlipidemia, unspecified: Secondary | ICD-10-CM | POA: Diagnosis not present

## 2020-06-23 DIAGNOSIS — R269 Unspecified abnormalities of gait and mobility: Secondary | ICD-10-CM | POA: Diagnosis not present

## 2020-06-23 DIAGNOSIS — I10 Essential (primary) hypertension: Secondary | ICD-10-CM | POA: Diagnosis not present

## 2020-06-23 DIAGNOSIS — T8141XA Infection following a procedure, superficial incisional surgical site, initial encounter: Secondary | ICD-10-CM | POA: Diagnosis not present

## 2020-06-23 DIAGNOSIS — E1151 Type 2 diabetes mellitus with diabetic peripheral angiopathy without gangrene: Secondary | ICD-10-CM | POA: Diagnosis not present

## 2020-06-23 DIAGNOSIS — T8131XA Disruption of external operation (surgical) wound, not elsewhere classified, initial encounter: Secondary | ICD-10-CM | POA: Diagnosis not present

## 2020-06-23 DIAGNOSIS — E039 Hypothyroidism, unspecified: Secondary | ICD-10-CM | POA: Diagnosis not present

## 2020-06-23 DIAGNOSIS — F17210 Nicotine dependence, cigarettes, uncomplicated: Secondary | ICD-10-CM | POA: Diagnosis not present

## 2020-06-23 DIAGNOSIS — Z8673 Personal history of transient ischemic attack (TIA), and cerebral infarction without residual deficits: Secondary | ICD-10-CM | POA: Diagnosis not present

## 2020-06-23 DIAGNOSIS — T8189XA Other complications of procedures, not elsewhere classified, initial encounter: Secondary | ICD-10-CM | POA: Diagnosis not present

## 2020-06-23 DIAGNOSIS — L03314 Cellulitis of groin: Secondary | ICD-10-CM | POA: Diagnosis not present

## 2020-06-23 DIAGNOSIS — Z720 Tobacco use: Secondary | ICD-10-CM | POA: Diagnosis not present

## 2020-06-23 DIAGNOSIS — N179 Acute kidney failure, unspecified: Secondary | ICD-10-CM | POA: Diagnosis not present

## 2020-06-23 DIAGNOSIS — T8149XA Infection following a procedure, other surgical site, initial encounter: Secondary | ICD-10-CM | POA: Diagnosis not present

## 2020-06-24 DIAGNOSIS — T8149XA Infection following a procedure, other surgical site, initial encounter: Secondary | ICD-10-CM | POA: Diagnosis not present

## 2020-06-24 DIAGNOSIS — T8189XA Other complications of procedures, not elsewhere classified, initial encounter: Secondary | ICD-10-CM | POA: Diagnosis not present

## 2020-06-25 DIAGNOSIS — T8189XA Other complications of procedures, not elsewhere classified, initial encounter: Secondary | ICD-10-CM | POA: Diagnosis not present

## 2020-06-29 DIAGNOSIS — I10 Essential (primary) hypertension: Secondary | ICD-10-CM | POA: Diagnosis not present

## 2020-06-29 DIAGNOSIS — Z7982 Long term (current) use of aspirin: Secondary | ICD-10-CM | POA: Diagnosis not present

## 2020-06-29 DIAGNOSIS — Z9181 History of falling: Secondary | ICD-10-CM | POA: Diagnosis not present

## 2020-06-29 DIAGNOSIS — T8149XA Infection following a procedure, other surgical site, initial encounter: Secondary | ICD-10-CM | POA: Diagnosis not present

## 2020-06-29 DIAGNOSIS — M1990 Unspecified osteoarthritis, unspecified site: Secondary | ICD-10-CM | POA: Diagnosis not present

## 2020-06-29 DIAGNOSIS — E785 Hyperlipidemia, unspecified: Secondary | ICD-10-CM | POA: Diagnosis not present

## 2020-06-29 DIAGNOSIS — T8131XA Disruption of external operation (surgical) wound, not elsewhere classified, initial encounter: Secondary | ICD-10-CM | POA: Diagnosis not present

## 2020-06-29 DIAGNOSIS — N179 Acute kidney failure, unspecified: Secondary | ICD-10-CM | POA: Diagnosis not present

## 2020-06-29 DIAGNOSIS — Z7984 Long term (current) use of oral hypoglycemic drugs: Secondary | ICD-10-CM | POA: Diagnosis not present

## 2020-06-29 DIAGNOSIS — F32A Depression, unspecified: Secondary | ICD-10-CM | POA: Diagnosis not present

## 2020-06-29 DIAGNOSIS — Z7951 Long term (current) use of inhaled steroids: Secondary | ICD-10-CM | POA: Diagnosis not present

## 2020-06-29 DIAGNOSIS — E1151 Type 2 diabetes mellitus with diabetic peripheral angiopathy without gangrene: Secondary | ICD-10-CM | POA: Diagnosis not present

## 2020-06-29 DIAGNOSIS — F17210 Nicotine dependence, cigarettes, uncomplicated: Secondary | ICD-10-CM | POA: Diagnosis not present

## 2020-06-29 DIAGNOSIS — Z7902 Long term (current) use of antithrombotics/antiplatelets: Secondary | ICD-10-CM | POA: Diagnosis not present

## 2020-06-29 DIAGNOSIS — Z8673 Personal history of transient ischemic attack (TIA), and cerebral infarction without residual deficits: Secondary | ICD-10-CM | POA: Diagnosis not present

## 2020-06-29 DIAGNOSIS — J45909 Unspecified asthma, uncomplicated: Secondary | ICD-10-CM | POA: Diagnosis not present

## 2020-07-01 DIAGNOSIS — Z7951 Long term (current) use of inhaled steroids: Secondary | ICD-10-CM | POA: Diagnosis not present

## 2020-07-01 DIAGNOSIS — Z7982 Long term (current) use of aspirin: Secondary | ICD-10-CM | POA: Diagnosis not present

## 2020-07-01 DIAGNOSIS — N179 Acute kidney failure, unspecified: Secondary | ICD-10-CM | POA: Diagnosis not present

## 2020-07-01 DIAGNOSIS — Z7984 Long term (current) use of oral hypoglycemic drugs: Secondary | ICD-10-CM | POA: Diagnosis not present

## 2020-07-01 DIAGNOSIS — Z7902 Long term (current) use of antithrombotics/antiplatelets: Secondary | ICD-10-CM | POA: Diagnosis not present

## 2020-07-01 DIAGNOSIS — F32A Depression, unspecified: Secondary | ICD-10-CM | POA: Diagnosis not present

## 2020-07-01 DIAGNOSIS — J45909 Unspecified asthma, uncomplicated: Secondary | ICD-10-CM | POA: Diagnosis not present

## 2020-07-01 DIAGNOSIS — E1151 Type 2 diabetes mellitus with diabetic peripheral angiopathy without gangrene: Secondary | ICD-10-CM | POA: Diagnosis not present

## 2020-07-01 DIAGNOSIS — T8149XA Infection following a procedure, other surgical site, initial encounter: Secondary | ICD-10-CM | POA: Diagnosis not present

## 2020-07-01 DIAGNOSIS — I10 Essential (primary) hypertension: Secondary | ICD-10-CM | POA: Diagnosis not present

## 2020-07-01 DIAGNOSIS — Z9181 History of falling: Secondary | ICD-10-CM | POA: Diagnosis not present

## 2020-07-01 DIAGNOSIS — E785 Hyperlipidemia, unspecified: Secondary | ICD-10-CM | POA: Diagnosis not present

## 2020-07-01 DIAGNOSIS — T8131XA Disruption of external operation (surgical) wound, not elsewhere classified, initial encounter: Secondary | ICD-10-CM | POA: Diagnosis not present

## 2020-07-01 DIAGNOSIS — F17210 Nicotine dependence, cigarettes, uncomplicated: Secondary | ICD-10-CM | POA: Diagnosis not present

## 2020-07-01 DIAGNOSIS — M1990 Unspecified osteoarthritis, unspecified site: Secondary | ICD-10-CM | POA: Diagnosis not present

## 2020-07-01 DIAGNOSIS — Z8673 Personal history of transient ischemic attack (TIA), and cerebral infarction without residual deficits: Secondary | ICD-10-CM | POA: Diagnosis not present

## 2020-07-04 DIAGNOSIS — I10 Essential (primary) hypertension: Secondary | ICD-10-CM | POA: Diagnosis not present

## 2020-07-04 DIAGNOSIS — Z9181 History of falling: Secondary | ICD-10-CM | POA: Diagnosis not present

## 2020-07-04 DIAGNOSIS — J45909 Unspecified asthma, uncomplicated: Secondary | ICD-10-CM | POA: Diagnosis not present

## 2020-07-04 DIAGNOSIS — Z7902 Long term (current) use of antithrombotics/antiplatelets: Secondary | ICD-10-CM | POA: Diagnosis not present

## 2020-07-04 DIAGNOSIS — F17210 Nicotine dependence, cigarettes, uncomplicated: Secondary | ICD-10-CM | POA: Diagnosis not present

## 2020-07-04 DIAGNOSIS — N179 Acute kidney failure, unspecified: Secondary | ICD-10-CM | POA: Diagnosis not present

## 2020-07-04 DIAGNOSIS — Z7951 Long term (current) use of inhaled steroids: Secondary | ICD-10-CM | POA: Diagnosis not present

## 2020-07-04 DIAGNOSIS — Z7982 Long term (current) use of aspirin: Secondary | ICD-10-CM | POA: Diagnosis not present

## 2020-07-04 DIAGNOSIS — T8149XA Infection following a procedure, other surgical site, initial encounter: Secondary | ICD-10-CM | POA: Diagnosis not present

## 2020-07-04 DIAGNOSIS — T8131XA Disruption of external operation (surgical) wound, not elsewhere classified, initial encounter: Secondary | ICD-10-CM | POA: Diagnosis not present

## 2020-07-04 DIAGNOSIS — M1990 Unspecified osteoarthritis, unspecified site: Secondary | ICD-10-CM | POA: Diagnosis not present

## 2020-07-04 DIAGNOSIS — Z7984 Long term (current) use of oral hypoglycemic drugs: Secondary | ICD-10-CM | POA: Diagnosis not present

## 2020-07-04 DIAGNOSIS — E785 Hyperlipidemia, unspecified: Secondary | ICD-10-CM | POA: Diagnosis not present

## 2020-07-04 DIAGNOSIS — E1151 Type 2 diabetes mellitus with diabetic peripheral angiopathy without gangrene: Secondary | ICD-10-CM | POA: Diagnosis not present

## 2020-07-04 DIAGNOSIS — F32A Depression, unspecified: Secondary | ICD-10-CM | POA: Diagnosis not present

## 2020-07-04 DIAGNOSIS — Z8673 Personal history of transient ischemic attack (TIA), and cerebral infarction without residual deficits: Secondary | ICD-10-CM | POA: Diagnosis not present

## 2020-07-06 DIAGNOSIS — Z7902 Long term (current) use of antithrombotics/antiplatelets: Secondary | ICD-10-CM | POA: Diagnosis not present

## 2020-07-06 DIAGNOSIS — M1990 Unspecified osteoarthritis, unspecified site: Secondary | ICD-10-CM | POA: Diagnosis not present

## 2020-07-06 DIAGNOSIS — F32A Depression, unspecified: Secondary | ICD-10-CM | POA: Diagnosis not present

## 2020-07-06 DIAGNOSIS — Z7982 Long term (current) use of aspirin: Secondary | ICD-10-CM | POA: Diagnosis not present

## 2020-07-06 DIAGNOSIS — I10 Essential (primary) hypertension: Secondary | ICD-10-CM | POA: Diagnosis not present

## 2020-07-06 DIAGNOSIS — F17210 Nicotine dependence, cigarettes, uncomplicated: Secondary | ICD-10-CM | POA: Diagnosis not present

## 2020-07-06 DIAGNOSIS — Z9181 History of falling: Secondary | ICD-10-CM | POA: Diagnosis not present

## 2020-07-06 DIAGNOSIS — N179 Acute kidney failure, unspecified: Secondary | ICD-10-CM | POA: Diagnosis not present

## 2020-07-06 DIAGNOSIS — T8131XA Disruption of external operation (surgical) wound, not elsewhere classified, initial encounter: Secondary | ICD-10-CM | POA: Diagnosis not present

## 2020-07-06 DIAGNOSIS — Z8673 Personal history of transient ischemic attack (TIA), and cerebral infarction without residual deficits: Secondary | ICD-10-CM | POA: Diagnosis not present

## 2020-07-06 DIAGNOSIS — J45909 Unspecified asthma, uncomplicated: Secondary | ICD-10-CM | POA: Diagnosis not present

## 2020-07-06 DIAGNOSIS — Z7951 Long term (current) use of inhaled steroids: Secondary | ICD-10-CM | POA: Diagnosis not present

## 2020-07-06 DIAGNOSIS — Z7984 Long term (current) use of oral hypoglycemic drugs: Secondary | ICD-10-CM | POA: Diagnosis not present

## 2020-07-06 DIAGNOSIS — E1151 Type 2 diabetes mellitus with diabetic peripheral angiopathy without gangrene: Secondary | ICD-10-CM | POA: Diagnosis not present

## 2020-07-06 DIAGNOSIS — T8149XA Infection following a procedure, other surgical site, initial encounter: Secondary | ICD-10-CM | POA: Diagnosis not present

## 2020-07-06 DIAGNOSIS — E785 Hyperlipidemia, unspecified: Secondary | ICD-10-CM | POA: Diagnosis not present

## 2020-07-08 DIAGNOSIS — Z7902 Long term (current) use of antithrombotics/antiplatelets: Secondary | ICD-10-CM | POA: Diagnosis not present

## 2020-07-08 DIAGNOSIS — N179 Acute kidney failure, unspecified: Secondary | ICD-10-CM | POA: Diagnosis not present

## 2020-07-08 DIAGNOSIS — T8131XA Disruption of external operation (surgical) wound, not elsewhere classified, initial encounter: Secondary | ICD-10-CM | POA: Diagnosis not present

## 2020-07-08 DIAGNOSIS — F17210 Nicotine dependence, cigarettes, uncomplicated: Secondary | ICD-10-CM | POA: Diagnosis not present

## 2020-07-08 DIAGNOSIS — Z8673 Personal history of transient ischemic attack (TIA), and cerebral infarction without residual deficits: Secondary | ICD-10-CM | POA: Diagnosis not present

## 2020-07-08 DIAGNOSIS — Z7984 Long term (current) use of oral hypoglycemic drugs: Secondary | ICD-10-CM | POA: Diagnosis not present

## 2020-07-08 DIAGNOSIS — T8149XA Infection following a procedure, other surgical site, initial encounter: Secondary | ICD-10-CM | POA: Diagnosis not present

## 2020-07-08 DIAGNOSIS — Z9181 History of falling: Secondary | ICD-10-CM | POA: Diagnosis not present

## 2020-07-08 DIAGNOSIS — Z7982 Long term (current) use of aspirin: Secondary | ICD-10-CM | POA: Diagnosis not present

## 2020-07-08 DIAGNOSIS — E1151 Type 2 diabetes mellitus with diabetic peripheral angiopathy without gangrene: Secondary | ICD-10-CM | POA: Diagnosis not present

## 2020-07-08 DIAGNOSIS — F32A Depression, unspecified: Secondary | ICD-10-CM | POA: Diagnosis not present

## 2020-07-08 DIAGNOSIS — M1990 Unspecified osteoarthritis, unspecified site: Secondary | ICD-10-CM | POA: Diagnosis not present

## 2020-07-08 DIAGNOSIS — E785 Hyperlipidemia, unspecified: Secondary | ICD-10-CM | POA: Diagnosis not present

## 2020-07-08 DIAGNOSIS — I10 Essential (primary) hypertension: Secondary | ICD-10-CM | POA: Diagnosis not present

## 2020-07-08 DIAGNOSIS — Z7951 Long term (current) use of inhaled steroids: Secondary | ICD-10-CM | POA: Diagnosis not present

## 2020-07-08 DIAGNOSIS — J45909 Unspecified asthma, uncomplicated: Secondary | ICD-10-CM | POA: Diagnosis not present

## 2020-07-11 DIAGNOSIS — I10 Essential (primary) hypertension: Secondary | ICD-10-CM | POA: Diagnosis not present

## 2020-07-11 DIAGNOSIS — E1151 Type 2 diabetes mellitus with diabetic peripheral angiopathy without gangrene: Secondary | ICD-10-CM | POA: Diagnosis not present

## 2020-07-11 DIAGNOSIS — N179 Acute kidney failure, unspecified: Secondary | ICD-10-CM | POA: Diagnosis not present

## 2020-07-11 DIAGNOSIS — E785 Hyperlipidemia, unspecified: Secondary | ICD-10-CM | POA: Diagnosis not present

## 2020-07-11 DIAGNOSIS — F17210 Nicotine dependence, cigarettes, uncomplicated: Secondary | ICD-10-CM | POA: Diagnosis not present

## 2020-07-11 DIAGNOSIS — T8149XA Infection following a procedure, other surgical site, initial encounter: Secondary | ICD-10-CM | POA: Diagnosis not present

## 2020-07-11 DIAGNOSIS — Z7982 Long term (current) use of aspirin: Secondary | ICD-10-CM | POA: Diagnosis not present

## 2020-07-11 DIAGNOSIS — M1990 Unspecified osteoarthritis, unspecified site: Secondary | ICD-10-CM | POA: Diagnosis not present

## 2020-07-11 DIAGNOSIS — Z7951 Long term (current) use of inhaled steroids: Secondary | ICD-10-CM | POA: Diagnosis not present

## 2020-07-11 DIAGNOSIS — Z7984 Long term (current) use of oral hypoglycemic drugs: Secondary | ICD-10-CM | POA: Diagnosis not present

## 2020-07-11 DIAGNOSIS — Z7902 Long term (current) use of antithrombotics/antiplatelets: Secondary | ICD-10-CM | POA: Diagnosis not present

## 2020-07-11 DIAGNOSIS — J45909 Unspecified asthma, uncomplicated: Secondary | ICD-10-CM | POA: Diagnosis not present

## 2020-07-11 DIAGNOSIS — Z9181 History of falling: Secondary | ICD-10-CM | POA: Diagnosis not present

## 2020-07-11 DIAGNOSIS — Z8673 Personal history of transient ischemic attack (TIA), and cerebral infarction without residual deficits: Secondary | ICD-10-CM | POA: Diagnosis not present

## 2020-07-11 DIAGNOSIS — T8131XA Disruption of external operation (surgical) wound, not elsewhere classified, initial encounter: Secondary | ICD-10-CM | POA: Diagnosis not present

## 2020-07-11 DIAGNOSIS — F32A Depression, unspecified: Secondary | ICD-10-CM | POA: Diagnosis not present

## 2020-07-15 DIAGNOSIS — Z7982 Long term (current) use of aspirin: Secondary | ICD-10-CM | POA: Diagnosis not present

## 2020-07-15 DIAGNOSIS — T8131XA Disruption of external operation (surgical) wound, not elsewhere classified, initial encounter: Secondary | ICD-10-CM | POA: Diagnosis not present

## 2020-07-15 DIAGNOSIS — Z9181 History of falling: Secondary | ICD-10-CM | POA: Diagnosis not present

## 2020-07-15 DIAGNOSIS — Z7984 Long term (current) use of oral hypoglycemic drugs: Secondary | ICD-10-CM | POA: Diagnosis not present

## 2020-07-15 DIAGNOSIS — I10 Essential (primary) hypertension: Secondary | ICD-10-CM | POA: Diagnosis not present

## 2020-07-15 DIAGNOSIS — E785 Hyperlipidemia, unspecified: Secondary | ICD-10-CM | POA: Diagnosis not present

## 2020-07-15 DIAGNOSIS — Z7902 Long term (current) use of antithrombotics/antiplatelets: Secondary | ICD-10-CM | POA: Diagnosis not present

## 2020-07-15 DIAGNOSIS — N179 Acute kidney failure, unspecified: Secondary | ICD-10-CM | POA: Diagnosis not present

## 2020-07-15 DIAGNOSIS — Z8673 Personal history of transient ischemic attack (TIA), and cerebral infarction without residual deficits: Secondary | ICD-10-CM | POA: Diagnosis not present

## 2020-07-15 DIAGNOSIS — F17210 Nicotine dependence, cigarettes, uncomplicated: Secondary | ICD-10-CM | POA: Diagnosis not present

## 2020-07-15 DIAGNOSIS — F32A Depression, unspecified: Secondary | ICD-10-CM | POA: Diagnosis not present

## 2020-07-15 DIAGNOSIS — J45909 Unspecified asthma, uncomplicated: Secondary | ICD-10-CM | POA: Diagnosis not present

## 2020-07-15 DIAGNOSIS — Z7951 Long term (current) use of inhaled steroids: Secondary | ICD-10-CM | POA: Diagnosis not present

## 2020-07-15 DIAGNOSIS — T8149XA Infection following a procedure, other surgical site, initial encounter: Secondary | ICD-10-CM | POA: Diagnosis not present

## 2020-07-15 DIAGNOSIS — M1990 Unspecified osteoarthritis, unspecified site: Secondary | ICD-10-CM | POA: Diagnosis not present

## 2020-07-15 DIAGNOSIS — E1151 Type 2 diabetes mellitus with diabetic peripheral angiopathy without gangrene: Secondary | ICD-10-CM | POA: Diagnosis not present

## 2020-07-16 DIAGNOSIS — T8189XA Other complications of procedures, not elsewhere classified, initial encounter: Secondary | ICD-10-CM | POA: Diagnosis not present

## 2020-07-18 DIAGNOSIS — J45909 Unspecified asthma, uncomplicated: Secondary | ICD-10-CM | POA: Diagnosis not present

## 2020-07-18 DIAGNOSIS — Z7951 Long term (current) use of inhaled steroids: Secondary | ICD-10-CM | POA: Diagnosis not present

## 2020-07-18 DIAGNOSIS — T8149XA Infection following a procedure, other surgical site, initial encounter: Secondary | ICD-10-CM | POA: Diagnosis not present

## 2020-07-18 DIAGNOSIS — M1990 Unspecified osteoarthritis, unspecified site: Secondary | ICD-10-CM | POA: Diagnosis not present

## 2020-07-18 DIAGNOSIS — N179 Acute kidney failure, unspecified: Secondary | ICD-10-CM | POA: Diagnosis not present

## 2020-07-18 DIAGNOSIS — Z7984 Long term (current) use of oral hypoglycemic drugs: Secondary | ICD-10-CM | POA: Diagnosis not present

## 2020-07-18 DIAGNOSIS — I10 Essential (primary) hypertension: Secondary | ICD-10-CM | POA: Diagnosis not present

## 2020-07-18 DIAGNOSIS — F32A Depression, unspecified: Secondary | ICD-10-CM | POA: Diagnosis not present

## 2020-07-18 DIAGNOSIS — F17210 Nicotine dependence, cigarettes, uncomplicated: Secondary | ICD-10-CM | POA: Diagnosis not present

## 2020-07-18 DIAGNOSIS — Z9181 History of falling: Secondary | ICD-10-CM | POA: Diagnosis not present

## 2020-07-18 DIAGNOSIS — E1151 Type 2 diabetes mellitus with diabetic peripheral angiopathy without gangrene: Secondary | ICD-10-CM | POA: Diagnosis not present

## 2020-07-18 DIAGNOSIS — Z7982 Long term (current) use of aspirin: Secondary | ICD-10-CM | POA: Diagnosis not present

## 2020-07-18 DIAGNOSIS — Z7902 Long term (current) use of antithrombotics/antiplatelets: Secondary | ICD-10-CM | POA: Diagnosis not present

## 2020-07-18 DIAGNOSIS — Z8673 Personal history of transient ischemic attack (TIA), and cerebral infarction without residual deficits: Secondary | ICD-10-CM | POA: Diagnosis not present

## 2020-07-18 DIAGNOSIS — E785 Hyperlipidemia, unspecified: Secondary | ICD-10-CM | POA: Diagnosis not present

## 2020-07-18 DIAGNOSIS — T8131XA Disruption of external operation (surgical) wound, not elsewhere classified, initial encounter: Secondary | ICD-10-CM | POA: Diagnosis not present

## 2020-07-20 DIAGNOSIS — Z7902 Long term (current) use of antithrombotics/antiplatelets: Secondary | ICD-10-CM | POA: Diagnosis not present

## 2020-07-20 DIAGNOSIS — I10 Essential (primary) hypertension: Secondary | ICD-10-CM | POA: Diagnosis not present

## 2020-07-20 DIAGNOSIS — Z8673 Personal history of transient ischemic attack (TIA), and cerebral infarction without residual deficits: Secondary | ICD-10-CM | POA: Diagnosis not present

## 2020-07-20 DIAGNOSIS — M1990 Unspecified osteoarthritis, unspecified site: Secondary | ICD-10-CM | POA: Diagnosis not present

## 2020-07-20 DIAGNOSIS — T8149XA Infection following a procedure, other surgical site, initial encounter: Secondary | ICD-10-CM | POA: Diagnosis not present

## 2020-07-20 DIAGNOSIS — E785 Hyperlipidemia, unspecified: Secondary | ICD-10-CM | POA: Diagnosis not present

## 2020-07-20 DIAGNOSIS — Z9181 History of falling: Secondary | ICD-10-CM | POA: Diagnosis not present

## 2020-07-20 DIAGNOSIS — E1151 Type 2 diabetes mellitus with diabetic peripheral angiopathy without gangrene: Secondary | ICD-10-CM | POA: Diagnosis not present

## 2020-07-20 DIAGNOSIS — T8131XA Disruption of external operation (surgical) wound, not elsewhere classified, initial encounter: Secondary | ICD-10-CM | POA: Diagnosis not present

## 2020-07-20 DIAGNOSIS — N179 Acute kidney failure, unspecified: Secondary | ICD-10-CM | POA: Diagnosis not present

## 2020-07-20 DIAGNOSIS — F32A Depression, unspecified: Secondary | ICD-10-CM | POA: Diagnosis not present

## 2020-07-20 DIAGNOSIS — Z7982 Long term (current) use of aspirin: Secondary | ICD-10-CM | POA: Diagnosis not present

## 2020-07-20 DIAGNOSIS — Z7984 Long term (current) use of oral hypoglycemic drugs: Secondary | ICD-10-CM | POA: Diagnosis not present

## 2020-07-20 DIAGNOSIS — F17210 Nicotine dependence, cigarettes, uncomplicated: Secondary | ICD-10-CM | POA: Diagnosis not present

## 2020-07-20 DIAGNOSIS — J45909 Unspecified asthma, uncomplicated: Secondary | ICD-10-CM | POA: Diagnosis not present

## 2020-07-20 DIAGNOSIS — Z7951 Long term (current) use of inhaled steroids: Secondary | ICD-10-CM | POA: Diagnosis not present

## 2020-07-22 DIAGNOSIS — M1990 Unspecified osteoarthritis, unspecified site: Secondary | ICD-10-CM | POA: Diagnosis not present

## 2020-07-22 DIAGNOSIS — Z7984 Long term (current) use of oral hypoglycemic drugs: Secondary | ICD-10-CM | POA: Diagnosis not present

## 2020-07-22 DIAGNOSIS — T8149XA Infection following a procedure, other surgical site, initial encounter: Secondary | ICD-10-CM | POA: Diagnosis not present

## 2020-07-22 DIAGNOSIS — Z9181 History of falling: Secondary | ICD-10-CM | POA: Diagnosis not present

## 2020-07-22 DIAGNOSIS — E1151 Type 2 diabetes mellitus with diabetic peripheral angiopathy without gangrene: Secondary | ICD-10-CM | POA: Diagnosis not present

## 2020-07-22 DIAGNOSIS — N179 Acute kidney failure, unspecified: Secondary | ICD-10-CM | POA: Diagnosis not present

## 2020-07-22 DIAGNOSIS — Z8673 Personal history of transient ischemic attack (TIA), and cerebral infarction without residual deficits: Secondary | ICD-10-CM | POA: Diagnosis not present

## 2020-07-22 DIAGNOSIS — F17210 Nicotine dependence, cigarettes, uncomplicated: Secondary | ICD-10-CM | POA: Diagnosis not present

## 2020-07-22 DIAGNOSIS — J45909 Unspecified asthma, uncomplicated: Secondary | ICD-10-CM | POA: Diagnosis not present

## 2020-07-22 DIAGNOSIS — T8131XA Disruption of external operation (surgical) wound, not elsewhere classified, initial encounter: Secondary | ICD-10-CM | POA: Diagnosis not present

## 2020-07-22 DIAGNOSIS — Z7951 Long term (current) use of inhaled steroids: Secondary | ICD-10-CM | POA: Diagnosis not present

## 2020-07-22 DIAGNOSIS — E785 Hyperlipidemia, unspecified: Secondary | ICD-10-CM | POA: Diagnosis not present

## 2020-07-22 DIAGNOSIS — Z7902 Long term (current) use of antithrombotics/antiplatelets: Secondary | ICD-10-CM | POA: Diagnosis not present

## 2020-07-22 DIAGNOSIS — Z7982 Long term (current) use of aspirin: Secondary | ICD-10-CM | POA: Diagnosis not present

## 2020-07-22 DIAGNOSIS — I10 Essential (primary) hypertension: Secondary | ICD-10-CM | POA: Diagnosis not present

## 2020-07-22 DIAGNOSIS — F32A Depression, unspecified: Secondary | ICD-10-CM | POA: Diagnosis not present

## 2020-07-27 DIAGNOSIS — Z7902 Long term (current) use of antithrombotics/antiplatelets: Secondary | ICD-10-CM | POA: Diagnosis not present

## 2020-07-27 DIAGNOSIS — T8131XA Disruption of external operation (surgical) wound, not elsewhere classified, initial encounter: Secondary | ICD-10-CM | POA: Diagnosis not present

## 2020-07-27 DIAGNOSIS — F32A Depression, unspecified: Secondary | ICD-10-CM | POA: Diagnosis not present

## 2020-07-27 DIAGNOSIS — Z7982 Long term (current) use of aspirin: Secondary | ICD-10-CM | POA: Diagnosis not present

## 2020-07-27 DIAGNOSIS — E785 Hyperlipidemia, unspecified: Secondary | ICD-10-CM | POA: Diagnosis not present

## 2020-07-27 DIAGNOSIS — Z8673 Personal history of transient ischemic attack (TIA), and cerebral infarction without residual deficits: Secondary | ICD-10-CM | POA: Diagnosis not present

## 2020-07-27 DIAGNOSIS — T8149XA Infection following a procedure, other surgical site, initial encounter: Secondary | ICD-10-CM | POA: Diagnosis not present

## 2020-07-27 DIAGNOSIS — I10 Essential (primary) hypertension: Secondary | ICD-10-CM | POA: Diagnosis not present

## 2020-07-27 DIAGNOSIS — J45909 Unspecified asthma, uncomplicated: Secondary | ICD-10-CM | POA: Diagnosis not present

## 2020-07-27 DIAGNOSIS — Z9181 History of falling: Secondary | ICD-10-CM | POA: Diagnosis not present

## 2020-07-27 DIAGNOSIS — M1990 Unspecified osteoarthritis, unspecified site: Secondary | ICD-10-CM | POA: Diagnosis not present

## 2020-07-27 DIAGNOSIS — N179 Acute kidney failure, unspecified: Secondary | ICD-10-CM | POA: Diagnosis not present

## 2020-07-27 DIAGNOSIS — Z7984 Long term (current) use of oral hypoglycemic drugs: Secondary | ICD-10-CM | POA: Diagnosis not present

## 2020-07-27 DIAGNOSIS — Z7951 Long term (current) use of inhaled steroids: Secondary | ICD-10-CM | POA: Diagnosis not present

## 2020-07-27 DIAGNOSIS — F17210 Nicotine dependence, cigarettes, uncomplicated: Secondary | ICD-10-CM | POA: Diagnosis not present

## 2020-07-27 DIAGNOSIS — E1151 Type 2 diabetes mellitus with diabetic peripheral angiopathy without gangrene: Secondary | ICD-10-CM | POA: Diagnosis not present

## 2020-08-09 DIAGNOSIS — Z7951 Long term (current) use of inhaled steroids: Secondary | ICD-10-CM | POA: Diagnosis not present

## 2020-08-09 DIAGNOSIS — F32A Depression, unspecified: Secondary | ICD-10-CM | POA: Diagnosis not present

## 2020-08-09 DIAGNOSIS — Z8673 Personal history of transient ischemic attack (TIA), and cerebral infarction without residual deficits: Secondary | ICD-10-CM | POA: Diagnosis not present

## 2020-08-09 DIAGNOSIS — M1990 Unspecified osteoarthritis, unspecified site: Secondary | ICD-10-CM | POA: Diagnosis not present

## 2020-08-09 DIAGNOSIS — Z9181 History of falling: Secondary | ICD-10-CM | POA: Diagnosis not present

## 2020-08-09 DIAGNOSIS — F17210 Nicotine dependence, cigarettes, uncomplicated: Secondary | ICD-10-CM | POA: Diagnosis not present

## 2020-08-09 DIAGNOSIS — Z7982 Long term (current) use of aspirin: Secondary | ICD-10-CM | POA: Diagnosis not present

## 2020-08-09 DIAGNOSIS — J45909 Unspecified asthma, uncomplicated: Secondary | ICD-10-CM | POA: Diagnosis not present

## 2020-08-09 DIAGNOSIS — E785 Hyperlipidemia, unspecified: Secondary | ICD-10-CM | POA: Diagnosis not present

## 2020-08-09 DIAGNOSIS — T8149XA Infection following a procedure, other surgical site, initial encounter: Secondary | ICD-10-CM | POA: Diagnosis not present

## 2020-08-09 DIAGNOSIS — E1151 Type 2 diabetes mellitus with diabetic peripheral angiopathy without gangrene: Secondary | ICD-10-CM | POA: Diagnosis not present

## 2020-08-09 DIAGNOSIS — N179 Acute kidney failure, unspecified: Secondary | ICD-10-CM | POA: Diagnosis not present

## 2020-08-09 DIAGNOSIS — T8131XA Disruption of external operation (surgical) wound, not elsewhere classified, initial encounter: Secondary | ICD-10-CM | POA: Diagnosis not present

## 2020-08-09 DIAGNOSIS — Z7902 Long term (current) use of antithrombotics/antiplatelets: Secondary | ICD-10-CM | POA: Diagnosis not present

## 2020-08-09 DIAGNOSIS — Z7984 Long term (current) use of oral hypoglycemic drugs: Secondary | ICD-10-CM | POA: Diagnosis not present

## 2020-08-09 DIAGNOSIS — I10 Essential (primary) hypertension: Secondary | ICD-10-CM | POA: Diagnosis not present

## 2020-09-19 DIAGNOSIS — J309 Allergic rhinitis, unspecified: Secondary | ICD-10-CM | POA: Diagnosis not present

## 2020-09-19 DIAGNOSIS — F32A Depression, unspecified: Secondary | ICD-10-CM | POA: Diagnosis not present

## 2020-09-19 DIAGNOSIS — E1165 Type 2 diabetes mellitus with hyperglycemia: Secondary | ICD-10-CM | POA: Diagnosis not present

## 2020-09-19 DIAGNOSIS — E079 Disorder of thyroid, unspecified: Secondary | ICD-10-CM | POA: Diagnosis not present

## 2020-09-19 DIAGNOSIS — Z794 Long term (current) use of insulin: Secondary | ICD-10-CM | POA: Diagnosis not present

## 2020-09-19 DIAGNOSIS — E7800 Pure hypercholesterolemia, unspecified: Secondary | ICD-10-CM | POA: Diagnosis not present

## 2020-09-19 DIAGNOSIS — F17200 Nicotine dependence, unspecified, uncomplicated: Secondary | ICD-10-CM | POA: Diagnosis not present

## 2020-09-22 DIAGNOSIS — T8189XD Other complications of procedures, not elsewhere classified, subsequent encounter: Secondary | ICD-10-CM | POA: Diagnosis not present

## 2020-09-22 DIAGNOSIS — E1165 Type 2 diabetes mellitus with hyperglycemia: Secondary | ICD-10-CM | POA: Diagnosis not present

## 2020-09-22 DIAGNOSIS — F17210 Nicotine dependence, cigarettes, uncomplicated: Secondary | ICD-10-CM | POA: Diagnosis not present

## 2020-09-22 DIAGNOSIS — Z7902 Long term (current) use of antithrombotics/antiplatelets: Secondary | ICD-10-CM | POA: Diagnosis not present

## 2020-09-22 DIAGNOSIS — Z95820 Peripheral vascular angioplasty status with implants and grafts: Secondary | ICD-10-CM | POA: Diagnosis not present

## 2020-09-22 DIAGNOSIS — Z7982 Long term (current) use of aspirin: Secondary | ICD-10-CM | POA: Diagnosis not present

## 2020-09-22 DIAGNOSIS — I739 Peripheral vascular disease, unspecified: Secondary | ICD-10-CM | POA: Diagnosis not present

## 2020-10-06 DIAGNOSIS — E1165 Type 2 diabetes mellitus with hyperglycemia: Secondary | ICD-10-CM | POA: Diagnosis not present

## 2020-10-06 DIAGNOSIS — T8189XD Other complications of procedures, not elsewhere classified, subsequent encounter: Secondary | ICD-10-CM | POA: Diagnosis not present

## 2020-10-06 DIAGNOSIS — I739 Peripheral vascular disease, unspecified: Secondary | ICD-10-CM | POA: Diagnosis not present

## 2020-10-06 DIAGNOSIS — I10 Essential (primary) hypertension: Secondary | ICD-10-CM | POA: Diagnosis not present

## 2020-10-06 DIAGNOSIS — F17200 Nicotine dependence, unspecified, uncomplicated: Secondary | ICD-10-CM | POA: Diagnosis not present

## 2020-10-06 DIAGNOSIS — E7800 Pure hypercholesterolemia, unspecified: Secondary | ICD-10-CM | POA: Diagnosis not present

## 2020-10-06 DIAGNOSIS — E1151 Type 2 diabetes mellitus with diabetic peripheral angiopathy without gangrene: Secondary | ICD-10-CM | POA: Diagnosis not present

## 2020-10-27 DIAGNOSIS — E119 Type 2 diabetes mellitus without complications: Secondary | ICD-10-CM | POA: Diagnosis not present

## 2020-10-27 DIAGNOSIS — Z0100 Encounter for examination of eyes and vision without abnormal findings: Secondary | ICD-10-CM | POA: Diagnosis not present

## 2020-11-03 DIAGNOSIS — E7800 Pure hypercholesterolemia, unspecified: Secondary | ICD-10-CM | POA: Diagnosis not present

## 2020-11-03 DIAGNOSIS — I739 Peripheral vascular disease, unspecified: Secondary | ICD-10-CM | POA: Diagnosis not present

## 2020-11-03 DIAGNOSIS — T8189XD Other complications of procedures, not elsewhere classified, subsequent encounter: Secondary | ICD-10-CM | POA: Diagnosis not present

## 2020-11-03 DIAGNOSIS — I10 Essential (primary) hypertension: Secondary | ICD-10-CM | POA: Diagnosis not present

## 2020-11-07 DIAGNOSIS — D2239 Melanocytic nevi of other parts of face: Secondary | ICD-10-CM | POA: Diagnosis not present

## 2020-11-07 DIAGNOSIS — D485 Neoplasm of uncertain behavior of skin: Secondary | ICD-10-CM | POA: Diagnosis not present

## 2020-11-07 DIAGNOSIS — L814 Other melanin hyperpigmentation: Secondary | ICD-10-CM | POA: Diagnosis not present

## 2020-11-07 DIAGNOSIS — D225 Melanocytic nevi of trunk: Secondary | ICD-10-CM | POA: Diagnosis not present
